# Patient Record
Sex: Female | Born: 1975 | Race: White | Hispanic: No | Marital: Single | Smoking: Never smoker
Health system: Southern US, Community
[De-identification: ages and names within clinical notes are randomized; demographics above are authoritative.]

## PROBLEM LIST (undated history)

## (undated) DIAGNOSIS — N879 Dysplasia of cervix uteri, unspecified: Secondary | ICD-10-CM

## (undated) DIAGNOSIS — F419 Anxiety disorder, unspecified: Secondary | ICD-10-CM

## (undated) DIAGNOSIS — K219 Gastro-esophageal reflux disease without esophagitis: Secondary | ICD-10-CM

## (undated) DIAGNOSIS — R87629 Unspecified abnormal cytological findings in specimens from vagina: Secondary | ICD-10-CM

## (undated) DIAGNOSIS — Z87410 Personal history of cervical dysplasia: Secondary | ICD-10-CM

## (undated) DIAGNOSIS — IMO0001 Reserved for inherently not codable concepts without codable children: Secondary | ICD-10-CM

## (undated) DIAGNOSIS — M533 Sacrococcygeal disorders, not elsewhere classified: Secondary | ICD-10-CM

## (undated) DIAGNOSIS — G8929 Other chronic pain: Secondary | ICD-10-CM

## (undated) DIAGNOSIS — I10 Essential (primary) hypertension: Secondary | ICD-10-CM

## (undated) DIAGNOSIS — M545 Low back pain: Principal | ICD-10-CM

## (undated) DIAGNOSIS — N949 Unspecified condition associated with female genital organs and menstrual cycle: Secondary | ICD-10-CM

## (undated) DIAGNOSIS — C801 Malignant (primary) neoplasm, unspecified: Secondary | ICD-10-CM

## (undated) HISTORY — DX: Unspecified condition associated with female genital organs and menstrual cycle: N94.9

## (undated) HISTORY — DX: Other chronic pain: G89.29

## (undated) HISTORY — DX: Malignant (primary) neoplasm, unspecified: C80.1

## (undated) HISTORY — DX: Unspecified abnormal cytological findings in specimens from vagina: R87.629

## (undated) HISTORY — PX: BACK SURGERY: SHX140

## (undated) HISTORY — PX: CHOLECYSTECTOMY: SHX55

## (undated) HISTORY — DX: Sacrococcygeal disorders, not elsewhere classified: M53.3

## (undated) HISTORY — DX: Gastro-esophageal reflux disease without esophagitis: K21.9

## (undated) HISTORY — DX: Low back pain: M54.5

## (undated) HISTORY — DX: Personal history of cervical dysplasia: Z87.410

## (undated) HISTORY — DX: Anxiety disorder, unspecified: F41.9

## (undated) HISTORY — PX: LEEP: SHX91

## (undated) HISTORY — DX: Essential (primary) hypertension: I10

## (undated) HISTORY — DX: Reserved for inherently not codable concepts without codable children: IMO0001

---

## 2002-11-24 ENCOUNTER — Other Ambulatory Visit: Admission: RE | Admit: 2002-11-24 | Discharge: 2002-11-24 | Payer: Self-pay | Admitting: Obstetrics & Gynecology

## 2002-12-15 ENCOUNTER — Ambulatory Visit (HOSPITAL_COMMUNITY): Admission: RE | Admit: 2002-12-15 | Discharge: 2002-12-15 | Payer: Self-pay | Admitting: Obstetrics & Gynecology

## 2002-12-17 ENCOUNTER — Ambulatory Visit (HOSPITAL_COMMUNITY): Admission: RE | Admit: 2002-12-17 | Discharge: 2002-12-17 | Payer: Self-pay | Admitting: Obstetrics & Gynecology

## 2004-01-28 ENCOUNTER — Inpatient Hospital Stay (HOSPITAL_COMMUNITY): Admission: AD | Admit: 2004-01-28 | Discharge: 2004-01-30 | Payer: Self-pay | Admitting: Obstetrics & Gynecology

## 2006-07-10 ENCOUNTER — Ambulatory Visit (HOSPITAL_COMMUNITY): Admission: RE | Admit: 2006-07-10 | Discharge: 2006-07-10 | Payer: Self-pay | Admitting: Obstetrics and Gynecology

## 2007-02-27 ENCOUNTER — Other Ambulatory Visit: Admission: RE | Admit: 2007-02-27 | Discharge: 2007-02-27 | Payer: Self-pay | Admitting: Obstetrics and Gynecology

## 2007-03-21 ENCOUNTER — Other Ambulatory Visit: Admission: RE | Admit: 2007-03-21 | Discharge: 2007-03-21 | Payer: Self-pay | Admitting: Obstetrics & Gynecology

## 2007-04-04 ENCOUNTER — Ambulatory Visit (HOSPITAL_COMMUNITY): Admission: RE | Admit: 2007-04-04 | Discharge: 2007-04-04 | Payer: Self-pay | Admitting: Obstetrics & Gynecology

## 2007-10-10 ENCOUNTER — Other Ambulatory Visit: Admission: RE | Admit: 2007-10-10 | Discharge: 2007-10-10 | Payer: Self-pay | Admitting: Obstetrics & Gynecology

## 2007-10-10 ENCOUNTER — Other Ambulatory Visit: Admission: RE | Admit: 2007-10-10 | Discharge: 2007-10-10 | Payer: Self-pay | Admitting: Obstetrics and Gynecology

## 2008-06-21 ENCOUNTER — Ambulatory Visit (HOSPITAL_COMMUNITY): Admission: RE | Admit: 2008-06-21 | Discharge: 2008-06-21 | Payer: Self-pay | Admitting: Pulmonary Disease

## 2008-06-30 ENCOUNTER — Ambulatory Visit (HOSPITAL_COMMUNITY): Admission: RE | Admit: 2008-06-30 | Discharge: 2008-06-30 | Payer: Self-pay | Admitting: Pulmonary Disease

## 2008-10-15 ENCOUNTER — Other Ambulatory Visit: Admission: RE | Admit: 2008-10-15 | Discharge: 2008-10-15 | Payer: Self-pay | Admitting: Obstetrics and Gynecology

## 2009-10-21 ENCOUNTER — Other Ambulatory Visit
Admission: RE | Admit: 2009-10-21 | Discharge: 2009-10-21 | Payer: Self-pay | Source: Home / Self Care | Admitting: Obstetrics & Gynecology

## 2010-06-09 ENCOUNTER — Ambulatory Visit (HOSPITAL_COMMUNITY): Admission: RE | Admit: 2010-06-09 | Payer: Self-pay | Source: Home / Self Care | Admitting: Pulmonary Disease

## 2010-06-21 ENCOUNTER — Encounter: Payer: Self-pay | Admitting: Pulmonary Disease

## 2010-10-03 NOTE — Op Note (Signed)
NAME:  Heather Mata, Heather Mata                 ACCOUNT NO.:  [id number]   MEDICAL RECORD NO.:  [id number]          PATIENT TYPE:  AMB   LOCATION:  DAY                           FACILITY:  [facility]   PHYSICIAN:  Lazaro Arms, M.D.   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   PROCEDURE:  Laser conization ablation of the cervix.   SURGEON:  Lazaro Arms, M.D.   ANESTHESIA:  Laryngeal mask airway.   FINDINGS:  Patient had an ASCUS Pap smear, but could not rule out HSIL  with negative HPV in the office, then underwent repeated her Pap smear,  it then showed high-grade dysplasia, then a colposcopy with directed  biopsies and it returned as high-grade dysplasia.  She had that back in  2004.  We did the laser at that time, as well.  Today, the findings were  identical to what I saw in the office last month.  She is on her  menstrual cycle.   DESCRIPTION OF OPERATION:  The patient was taken to the operating room,  placed in the supine position, underwent laryngeal mask airway, placed  in dorsal lithotomy position.  Graves speculum was placed and 3% acetic  acid was used and placed on the cervix.  Diagnostic colposcopy was  performed.  The above-noted findings were seen.   The laser was then used and we got a good margin of about 3 mm around  the entire transition zone, took it to a depth of 9 mm peripherally and  centrally.  There was good hemostasis.  I also put Monsel's on for  additional hemostasis.  The patient tolerated the procedure well.  She  experienced no blood loss and was taken to recovery in good, stable  condition.   Counts were correct.      Lazaro Arms, M.D.  Electronically  Signed     LHE/MEDQ  D:  04/04/2007  T:  04/04/2007  Job:  [id number]

## 2010-10-06 NOTE — H&P (Signed)
   NAME:  Heather Mata, Heather Mata                           ACCOUNT NO.:  [id number]   MEDICAL RECORD NO.:  [id number]                   PATIENT TYPE:  AMB   LOCATION:  DAY                                  FACILITY:  [facility]   PHYSICIAN:  Lazaro Arms, M.D.                DATE OF BIRTH:  1976-04-05   DATE OF ADMISSION:  12/15/2002  DATE OF DISCHARGE:                                HISTORY & PHYSICAL   HISTORY OF PRESENT ILLNESS:  Heather Mata is a 35 year old white female, gravida 1,  para 0, abortus 1 with last menstrual period of October 25, 2002 who had a Pap  smear which returned abnormal. She had a high-grade dysplasia on Pap and  high risk HPV-DNA as well a that time.  She underwent a colposcopy with  biopsy which revealed low-to-moderate grade dysplasia with __________ change  and involvement underlying endocervical glands.  The patient wants  definitive treatment and we talked about cryo.  I am really recommending a  laser of the cervix because of endocervical glandular involvement and her  high-risk HPV and DNA subtype.  As a result, she is admitted for laser  ablation of the cervix.   PAST MEDICAL HISTORY:  Distant history of asthma.   PAST SURGICAL HISTORY:  Negative.   PAST OBSTETRICAL HISTORY:  The one pregnancy termination.   MEDICATIONS:  Ventolin inhaler as needed and the Ortho Evra patch.   REVIEW OF SYSTEMS:  Otherwise negative.   PHYSICAL EXAMINATION:  HEENT:  Unremarkable.  NECK:  Thyroid is normal.  LUNGS:  Clear.  HEART:  Regular rhythm without regurgitation or gallop.  BREASTS: Deferred.  ABDOMEN:  Benign.  PELVIC:  Normal uterus tubes and ovaries.  On exam no masses, nontender.  EXTREMITIES:  Warm no edema.  NEUROLOGIC:  Exam is grossly intact.   IMPRESSION:  Moderate grade cervical dysplasia.   PLAN:  Patient is admitted for laser ablation of the cervix. She understands  the indications for the procedure, the risks of anesthesia, and the surgery,  and we will  proceed.                                              Lazaro Arms, M.D.   [name]  D:  12/16/2002  T:  12/16/2002  Job:  [id number]

## 2010-10-06 NOTE — Op Note (Signed)
NAME:  Heather Mata, Heather Mata                           ACCOUNT NO.:  [id number]   MEDICAL RECORD NO.:  [id number]                   PATIENT TYPE:  INP   LOCATION:  LDR1                                 FACILITY:  [facility]   PHYSICIAN:  Lazaro Arms, M.D.                DATE OF BIRTH:  06-Jun-1975   DATE OF PROCEDURE:  01/28/2004  DATE OF DISCHARGE:                                 OPERATIVE REPORT   DELIVERY NOTE:  [name] is a 35 year old, gravida 2, para 0, abortion 1,  progressed normally through the active phase of labor.  The infant was found  to be presenting on the perineum.  The patient had an epidural and did not  have any sensation of pushing at all.  The patient then began pushing,  pushed about 10 minutes, delivered a viable female at 2001 with Apgars of 9  and 9, weighing 7 pounds 1 ounce.  There was a two-vessel cord which was  found by ultrasound.  Placenta was normal.  Midline episiotomy was made.  There was no extension.  Cord blood and cord gas was sent. Placenta was  delivered spontaneously.  The uterus was firm and three fingerbreadths below  the umbilicus.  Blood loss for the delivery was __________  mL.  Midline  episiotomy was repair without difficulty.  The infant will go the routine  neonatal care and the patient will undergo routine postpartum care.  The  epidural catheter was removed, the blue tip intact.      ___________________________________________                                            Lazaro Arms, M.D.   LHE/MEDQ  D:  01/28/2004  T:  01/29/2004  Job:  [id number]

## 2010-10-06 NOTE — Op Note (Signed)
NAME:  Heather Mata, Heather Mata                           ACCOUNT NO.:  [id number]   MEDICAL RECORD NO.:  [id number]                   PATIENT TYPE:  INP   LOCATION:  LDR1                                 FACILITY:  [facility]   PHYSICIAN:  Lazaro Arms, M.D.                DATE OF BIRTH:  09-23-1975   DATE OF PROCEDURE:  01/28/2004  DATE OF DISCHARGE:                                 OPERATIVE REPORT   PROCEDURE:  Epidural placement note.   INDICATIONS:  The patient is a 35 year old, gravid 2, para 0, at term, AB 1,  in active phase of labor requesting an epidural.   DESCRIPTION OF PROCEDURE:  She was placed in the sitting position.  The L3  and L4 interspace identified.  The area was Betadine prepped and __________  drape.  One percent lidocaine was injected as a local anesthetic.  A 17-  gauge Tuohy needle was used and loss-of-resistance technique employed.  The  epidural space was found on the third pass.  The first two passes I was left  of the midline.  A loss-of-resistance technique employed and the epidural  space was found.  Bupivacaine plain 0.125% , 10 mL, was given.  The epidural  catheter was threaded into the space, 5 cm into the space.  Additional 10 mL  was given with no ill effects.  It was then taped down again 5 cm into the  space.  Continuous pump of 0.125% bupivacaine with 2 mcg/mL of fentanyl was  then begun at 12 mL an hour.  The patient had good pain relief.  She  experienced hypotension which resolved with increasing her fluids, IV and IM  ephedrine.  She became nauseated but that resolved aspirin well.  There was  never any fetal heart rate changes.  The patient has good comfort and will  go to routine labor management.      ___________________________________________                                            Lazaro Arms, M.D.   LHE/MEDQ  D:  01/28/2004  T:  01/29/2004  Job:  [id number]

## 2010-10-06 NOTE — H&P (Signed)
NAME:  Heather Mata, Heather Mata                           ACCOUNT NO.:  [id number]   MEDICAL RECORD NO.:  [id number]                   PATIENT TYPE:  INP   LOCATION:  LDR1                                 FACILITY:  [facility]   PHYSICIAN:  Lazaro Arms, M.D.                DATE OF BIRTH:  [birth year]   DATE OF ADMISSION:  01/28/2004  DATE OF DISCHARGE:                                HISTORY & PHYSICAL   HISTORY OF PRESENT ILLNESS:  Heather Mata is a 35 year old white female, gravida 2,  para 0, abortus 1, estimated date of delivery of February 16, 2004,  currently at 37-2/[redacted] weeks gestation, who is admitted in labor and delivery  in labor.  The patient was 3 cm three days ago.  Today in the office, was 4  cm, 50%, 0 to +1 station and bulging bag of water.  She states she was  having regular uterine contractions.  She was sent to labor and delivery and  found to be contracting every three minutes.  __________ came over and  checked her again, and she was 5.  As a result, she is admitted in labor.   PAST MEDICAL HISTORY:  Significant for cervical dysplasia.   PAST SURGICAL HISTORY:  She had a laser ablation of the cervix.  Past OB is  negative.   REVIEW OF SYSTEMS:  Negative.  This pregnancy is complicated by the  appearance of a two-vessel cord.  She went to [facility] and had a  normal high-resolution scan.  She also had a marginal previa that resolved.  Her AFP test was negative.   LABORATORY DATA:  Blood type is D positive, antibody screen is negative.  Rubella is immune.  Hepatitis B was negative.  HIV was nonreactive.  HSV-1  was positive.  HSV-2 was negative.  She has a history of cold sores.  Serology is nonreactive.  Pap was normal.  GC and chlamydia were both  negative.  AFP was normal as above.  Group B strep was negative.  Glucola  was abnormal, but she had a normal three-hour GGT.   PHYSICAL EXAMINATION:  HEENT:  Unremarkable.  Thyroid is normal.  LUNGS:  Clear.  HEART:  Regular rate and  rhythm without murmur, rubs or gallop.  BREASTS:  Without masses, discharges, or skin changes.  ABDOMEN:  Fundal height is 35 cm.  PELVIC:  Cervix as above.  Now 550 and 0 to +1 bulging bag of water, soft,  mid plane.  EXTREMITIES:  Warm with no edema.   IMPRESSION:  1.  Intrauterine pregnancy at 37-2/[redacted] weeks gestation.  2.  Labor.   PLAN:  The patient is admitted for management of labor.  She is requesting  an epidural which will be placed.  She will be augmented if needed, and  undergo an amniotomy.     ___________________________________________  Lazaro Arms, M.D.   [name]  D:  01/28/2004  T:  01/28/2004  Job:  [id number]

## 2010-10-06 NOTE — Procedures (Signed)
NAME:  Heather Mata, Heather Mata                 ACCOUNT NO.:  [id number]   MEDICAL RECORD NO.:  [id number]          PATIENT TYPE:  OUT   LOCATION:  RESP                          FACILITY:  [facility]   PHYSICIAN:  Edward L. Juanetta Gosling, M.D.DATE OF BIRTH:  25-Sep-1975   DATE OF PROCEDURE:  DATE OF DISCHARGE:  06/30/2008                            PULMONARY FUNCTION TEST   PULMONARY FUNCTION TEST:  1. Spirometry shows no airflow obstruction and normal flows and      volumes.  2. Lung volumes are normal.  3. DLCO is normal.      Edward L. Juanetta Gosling, M.D.  Electronically Signed     ELH/MEDQ  D:  07/02/2008  T:  07/02/2008  Job:  [id number]

## 2010-10-06 NOTE — Op Note (Signed)
   NAME:  Heather Mata, Heather Mata                           ACCOUNT NO.:  [id number]   MEDICAL RECORD NO.:  [id number]                   PATIENT TYPE:  AMB   LOCATION:  DAY                                  FACILITY:  [facility]   PHYSICIAN:  Lazaro Arms, M.D.                DATE OF BIRTH:  1976/04/07   DATE OF PROCEDURE:  12/17/2002  DATE OF DISCHARGE:                                 OPERATIVE REPORT   PREOPERATIVE DIAGNOSIS:  Moderate squamous intraepithelial lesion dysplasia.   POSTOPERATIVE DIAGNOSIS:  Moderate squamous intraepithelial lesion  dysplasia.   PROCEDURE:  Laser ablation of cervix.   SURGEON:  Lazaro Arms, M.D.   ANESTHESIA:  Laryngeal mask airway.   FINDINGS:  The patient underwent a colposcopic directed biopsy in the office  after an abnormal Pap smear. She was found to have moderate cervical  dysplasia with koilocytic atypia and endocervical glandular involvement.  As  a result she was admitted for laser ablation of the cervix.   DESCRIPTION OF OPERATION:  The patient was taken to the operating room,  placed in the supine position, where she underwent laryngeal mask airway.  She was then placed in the dorsal lithotomy position.  The perineum and  vagina were prepped with Betadine.  Moist towels were placed.  The speculum  was placed and a paracervical block was placed using 10 cc of 0.5%  Sensorcaine with 1:200,000 epinephrine bilaterally, 20 cc total.  The  microscope was then used.  Acetic acid was placed. A repeat colposcopy was  performed and confirmed the abnormality of the cervix.  The entire  transitional zone underwent ablation by the holmium laser to a depth of 7 mm  centrally and 5-7 mm peripherally.  There was good hemostasis, really no  bleeding at all.  Monsel's was placed in the procedure for long-term  hemostasis.  Speculum was removed.  The patient tolerated the procedure  well. She experienced no blood loss and was taken to recovery in good stable  condition.  All counts were correct.  She received Ancef and Toradol  prophylactically preoperatively.                                               Lazaro Arms, M.D.    [name]  D:  12/17/2002  T:  12/17/2002  Job:  [id number]

## 2011-01-21 ENCOUNTER — Encounter: Payer: Self-pay | Admitting: *Deleted

## 2011-01-21 ENCOUNTER — Emergency Department (HOSPITAL_COMMUNITY)
Admission: EM | Admit: 2011-01-21 | Discharge: 2011-01-21 | Disposition: A | Discharge status: Home / Self Care | Payer: BC Managed Care – PPO | Attending: Emergency Medicine | Admitting: Emergency Medicine

## 2011-01-21 ENCOUNTER — Emergency Department (HOSPITAL_COMMUNITY): Payer: BC Managed Care – PPO

## 2011-01-21 DIAGNOSIS — R0602 Shortness of breath: Secondary | ICD-10-CM | POA: Insufficient documentation

## 2011-01-21 DIAGNOSIS — J45909 Unspecified asthma, uncomplicated: Secondary | ICD-10-CM | POA: Insufficient documentation

## 2011-01-21 DIAGNOSIS — J029 Acute pharyngitis, unspecified: Secondary | ICD-10-CM

## 2011-01-21 DIAGNOSIS — IMO0001 Reserved for inherently not codable concepts without codable children: Secondary | ICD-10-CM

## 2011-01-21 LAB — URINALYSIS, ROUTINE W REFLEX MICROSCOPIC
Bilirubin Urine: NEGATIVE
Hgb urine dipstick: NEGATIVE
Leukocytes, UA: NEGATIVE
Nitrite: NEGATIVE
Urobilinogen, UA: 0.2 mg/dL (ref 0.0–1.0)

## 2011-01-21 LAB — POCT I-STAT, CHEM 8
Calcium, Ion: 1.26 mmol/L (ref 1.12–1.32)
Glucose, Bld: 100 mg/dL — ABNORMAL HIGH (ref 70–99)
TCO2: 24 mmol/L (ref 0–100)

## 2011-01-21 LAB — RAPID STREP SCREEN (MED CTR MEBANE ONLY): Streptococcus, Group A Screen (Direct): NEGATIVE

## 2011-01-21 MED ORDER — LORAZEPAM 1 MG PO TABS
0.5000 mg | ORAL_TABLET | Freq: Once | ORAL | Status: AC
  Administered 2011-01-21: 0.5 mg via ORAL
  Filled 2011-01-21: qty 1

## 2011-01-21 MED ORDER — ACETAMINOPHEN 325 MG PO TABS
ORAL_TABLET | ORAL | Status: AC
  Administered 2011-01-21: 21:00:00
  Filled 2011-01-21: qty 2

## 2011-01-21 MED ORDER — ALBUTEROL SULFATE (5 MG/ML) 0.5% IN NEBU
5.0000 mg | INHALATION_SOLUTION | Freq: Once | RESPIRATORY_TRACT | Status: AC
  Administered 2011-01-21: 5 mg via RESPIRATORY_TRACT
  Filled 2011-01-21 (×2): qty 0.5

## 2011-01-21 MED ORDER — IPRATROPIUM BROMIDE 0.02 % IN SOLN
0.5000 mg | Freq: Once | RESPIRATORY_TRACT | Status: AC
  Administered 2011-01-21: 0.5 mg via RESPIRATORY_TRACT
  Filled 2011-01-21: qty 2.5

## 2011-01-21 MED ORDER — LORAZEPAM 1 MG PO TABS: ORAL_TABLET | ORAL | Status: DC

## 2011-01-21 NOTE — ED Notes (Signed)
Pt c/o shortness of breath x 2 1/2 weeks. Pt also has laryngitis. Pt has cough but is not productive. Denies fever.

## 2011-01-21 NOTE — ED Notes (Signed)
Dr. Richrd Prime at patient bedside.

## 2011-01-21 NOTE — ED Provider Notes (Signed)
History     CSN: [id number] Arrival date & time: 01/21/2011  5:27 PM  Chief Complaint  Patient presents with  . Shortness of Breath   HPI Pt was seen at 1750.  Per pt, c/o gradual onset and persistence of constant SOB, cough and "laryngitis" x2-3 weeks.  Pt has had multiple visits to her PMD for same.  Has another appt with PMD already scheduled for Tuesday.  Pt states she has been taking PO and inhaled steroids, nebs QID, abx x2 courses, and antihistamines.  States she "still feel like I can't breathe."  Denies CP/palpitations, no fevers, no rash, no wheezing/stridor, no abd pain, no back pain, no N/V/D, no sore throat, no intra-oral edema.    Past Medical History  Diagnosis Date  . Asthma     Past Surgical History  Procedure Date  . Cholecystectomy     History reviewed. No pertinent family history.  History  Substance Use Topics  . Smoking status: Never Smoker   . Smokeless tobacco: Not on file  . Alcohol Use: No   Review of Systems ROS: Statement: All systems negative except as marked or noted in the HPI; Constitutional: Negative for fever and chills. ; ; Eyes: Negative for eye pain, redness and discharge. ; ; ENMT: Negative for ear pain, nasal congestion, sinus pressure, +sore throat and hoarseness. ; ; Cardiovascular: Negative for chest pain, palpitations, diaphoresis, and peripheral edema. ; ; Respiratory: +cough and SOB.  Negative for wheezing and stridor. ; ; Gastrointestinal: Negative for nausea, vomiting, diarrhea and abdominal pain, blood in stool, hematemesis, jaundice and rectal bleeding. . ; ; Genitourinary: Negative for dysuria, flank pain and hematuria. ; ; Musculoskeletal: Negative for back pain and neck pain. Negative for swelling and trauma.; ; Skin: Negative for pruritus, rash, abrasions, blisters, bruising and skin lesion.; ; Neuro: Negative for headache, lightheadedness and neck stiffness. Negative for weakness, altered level of consciousness , altered mental  status, extremity weakness, paresthesias, involuntary movement, seizure and syncope.    Physical Exam  BP 135/92  Pulse 120  Temp(Src) 98.6 F (37 C) (Oral)  Resp 20  Ht 5\' 2"  (1.575 m)  Wt 135 lb (61.236 kg)  BMI 24.69 kg/m2  SpO2 98%  LMP 12/22/2010 BP 126/84  Pulse 77  Temp(Src) 98.6 F (37 C) (Oral)  Resp 16  Ht 5\' 2"  (1.575 m)  Wt 135 lb (61.236 kg)  BMI 24.69 kg/m2  SpO2 97%  LMP 12/22/2010   Physical Exam 1755: Physical examination:  Nursing notes reviewed; Vital signs and O2 SAT reviewed;  Constitutional: Well developed, Well nourished, Well hydrated, In no acute distress; Head:  Normocephalic, atraumatic; Eyes: EOMI, PERRL, No scleral icterus; ENMT:  Mouth and pharynx normal, Mucous membranes moist, +voice hoarse, no drooling, no stridor.  No intra-oral edema or erythema.; Neck: Supple, Full range of motion, No lymphadenopathy; Cardiovascular: Regular rate and rhythm, No murmur, rub, or gallop; Respiratory: Breath sounds clear & equal bilaterally, No rales, rhonchi, wheezes, or rub, Normal respiratory effort/excursion; Chest: Nontender, Movement normal; Abdomen: Soft, Nontender, Nondistended, Normal bowel sounds;  Extremities: Pulses normal, No tenderness, No edema, No calf edema or asymmetry.; Neuro: AA&Ox3, Major CN grossly intact.  No gross focal motor or sensory deficits in extremities.; Skin: Color normal, Warm, Dry, no rash.  Affect:  +anxious.    ED Course  Procedures  MDM MDM Reviewed: nursing note and vitals Interpretation: labs and x-ray   1805:  Pt's father pulled me outside the pt's room after my eval.  States pt has been "very very anxious" and "when she gets more anxious her symptoms get worse."  Pt's father states pt was "doing better until her friend last night told her that she might not be getting enough oxygen into her" and "that made her get worse again."  He states to me that "she will never tell you that she's anxious."  Reassured.  Will ck CXR,  R/S, labs.  Will dose neb and ativan.  Pt and father agreeable with plan.     Results for orders placed during the hospital encounter of 01/21/11  URINALYSIS, ROUTINE W REFLEX MICROSCOPIC      Component Value Range   Color, Urine YELLOW  YELLOW    Appearance CLEAR  CLEAR    Specific Gravity, Urine <1.005 (*) 1.005 - 1.030    pH 7.0  5.0 - 8.0    Glucose, UA NEGATIVE  NEGATIVE (mg/dL)   Hgb urine dipstick NEGATIVE  NEGATIVE    Bilirubin Urine NEGATIVE  NEGATIVE    Ketones, ur NEGATIVE  NEGATIVE (mg/dL)   Protein, ur NEGATIVE  NEGATIVE (mg/dL)   Urobilinogen, UA 0.2  0.0 - 1.0 (mg/dL)   Nitrite NEGATIVE  NEGATIVE    Leukocytes, UA NEGATIVE  NEGATIVE   D-DIMER, QUANTITATIVE      Component Value Range   D-Dimer, Quant 0.25  0.00 - 0.48 (ug/mL-FEU)  RAPID STREP SCREEN      Component Value Range   Streptococcus, Group A Screen (Direct) NEGATIVE  NEGATIVE   POCT PREGNANCY, URINE      Component Value Range   Preg Test, Ur NEGATIVE    POCT I-STAT, CHEM 8      Component Value Range   Sodium 137  135 - 145 (mEq/L)   Potassium 4.1  3.5 - 5.1 (mEq/L)   Chloride 105  96 - 112 (mEq/L)   BUN 21  6 - 23 (mg/dL)   Creatinine, Ser 0.98  0.50 - 1.10 (mg/dL)   Glucose, Bld 119 (*) 70 - 99 (mg/dL)   Calcium, Ion 1.47  8.29 - 1.32 (mmol/L)   TCO2 24  0 - 100 (mmol/L)   Hemoglobin 13.6  12.0 - 15.0 (g/dL)   HCT 56.2  13.0 - 86.5 (%)   Dg Neck Soft Tissue  01/21/2011  *RADIOLOGY REPORT*  Clinical Data: Cough and shortness of breath.  Sore throat.  NECK SOFT TISSUES - 1+ VIEW  Comparison: None.  Findings: The airway is patent.  No focal soft tissue abnormality is evident.  The prevertebral soft tissues are within normal limits.  The epiglottis is unremarkable.  The lung apices are clear.  IMPRESSION: Negative soft tissue neck.  Original Report Authenticated By: Jamesetta Orleans. MATTERN, M.D.   Dg Chest 2 View  01/21/2011  *RADIOLOGY REPORT*  Clinical Data: Cough.  Sore throat.  CHEST - 2 VIEW   Comparison: 06/21/2008  Findings: Lateral view degraded by patient arm position.  Midline trachea.  Normal heart size and mediastinal contours. No pleural effusion or pneumothorax.  Clear lungs.  IMPRESSION: Normal chest.  Original Report Authenticated By: Consuello Bossier, M.D.   8:40 PM:  Improved after ativan and neb, appear less anxious.  States she feels "better" now and wants to go home.  States she is "hungry," and requesting tylenol for generalized "aches."  Already is on steroids, has been on 2 courses of abx, already has nebs at home.  Doubt infectious process at this time.  Possible anxiety component.  Will dose tylenol and  d/c home with outpt f/u on Tuesday as previously scheduled.  Dx testing d/w pt and family.  Questions answered.  Verb understanding, agreeable to d/c home with outpt f/u.        Medications given in ED:  albuterol (PROVENTIL) (5 MG/ML) 0.5% nebulizer solution 5 mg (5 mg Nebulization Given 01/21/11 1845)  ipratropium (ATROVENT) 0.02 % nebulizer solution 0.5 mg (0.5 mg Nebulization Given 01/21/11 1845)  LORazepam (ATIVAN) tablet 0.5 mg (0.5 mg Oral Given 01/21/11 1858)   New Prescriptions   LORAZEPAM (ATIVAN) 1 MG TABLET    ONE HALF to one tablet PO BID prn anxiety or sleeplessness   Paula Zietz Allison Quarry, DO 01/24/11 1356

## 2011-02-08 ENCOUNTER — Other Ambulatory Visit: Payer: Self-pay | Admitting: Obstetrics & Gynecology

## 2011-02-08 ENCOUNTER — Other Ambulatory Visit (HOSPITAL_COMMUNITY)
Admission: RE | Admit: 2011-02-08 | Discharge: 2011-02-08 | Disposition: A | Discharge status: Home / Self Care | Payer: BC Managed Care – PPO | Source: Ambulatory Visit | Attending: Obstetrics & Gynecology | Admitting: Obstetrics & Gynecology

## 2011-02-08 DIAGNOSIS — Z01419 Encounter for gynecological examination (general) (routine) without abnormal findings: Secondary | ICD-10-CM | POA: Insufficient documentation

## 2011-02-08 DIAGNOSIS — Z1159 Encounter for screening for other viral diseases: Secondary | ICD-10-CM | POA: Insufficient documentation

## 2011-02-27 LAB — CBC
Hemoglobin: 12.8
MCHC: 34.3
MCV: 87.9
RBC: 4.24

## 2011-02-27 LAB — DIFFERENTIAL
Basophils Absolute: 0
Basophils Relative: 0
Lymphocytes Relative: 28
Lymphs Abs: 2
Neutro Abs: 4.3
Neutrophils Relative %: 61

## 2011-02-27 LAB — COMPREHENSIVE METABOLIC PANEL
AST: 22
Alkaline Phosphatase: 62
Chloride: 105
Creatinine, Ser: 0.6
Glucose, Bld: 87
Potassium: 4.7
Total Bilirubin: 0.7

## 2011-02-27 LAB — URINALYSIS, ROUTINE W REFLEX MICROSCOPIC
Hgb urine dipstick: NEGATIVE
Ketones, ur: NEGATIVE
Nitrite: NEGATIVE
Urobilinogen, UA: 0.2
pH: 6

## 2011-05-03 ENCOUNTER — Other Ambulatory Visit (HOSPITAL_COMMUNITY): Payer: Self-pay | Admitting: Pulmonary Disease

## 2011-05-03 ENCOUNTER — Ambulatory Visit (HOSPITAL_COMMUNITY)
Admission: RE | Admit: 2011-05-03 | Discharge: 2011-05-03 | Disposition: A | Discharge status: Home / Self Care | Payer: BC Managed Care – PPO | Source: Ambulatory Visit | Attending: Pulmonary Disease | Admitting: Pulmonary Disease

## 2011-05-03 DIAGNOSIS — M545 Low back pain, unspecified: Secondary | ICD-10-CM | POA: Insufficient documentation

## 2012-01-30 ENCOUNTER — Other Ambulatory Visit: Payer: Self-pay | Admitting: Adult Health

## 2012-01-30 DIAGNOSIS — Z1151 Encounter for screening for human papillomavirus (HPV): Secondary | ICD-10-CM | POA: Insufficient documentation

## 2012-01-30 DIAGNOSIS — Z01419 Encounter for gynecological examination (general) (routine) without abnormal findings: Secondary | ICD-10-CM | POA: Insufficient documentation

## 2012-01-30 DIAGNOSIS — Z113 Encounter for screening for infections with a predominantly sexual mode of transmission: Secondary | ICD-10-CM | POA: Insufficient documentation

## 2012-02-05 ENCOUNTER — Other Ambulatory Visit (HOSPITAL_COMMUNITY)
Admission: RE | Admit: 2012-02-05 | Discharge: 2012-02-05 | Disposition: A | Discharge status: Home / Self Care | Payer: Medicaid Other | Source: Ambulatory Visit | Attending: Obstetrics and Gynecology | Admitting: Obstetrics and Gynecology

## 2012-10-20 ENCOUNTER — Other Ambulatory Visit: Payer: Self-pay | Admitting: Obstetrics & Gynecology

## 2012-12-11 ENCOUNTER — Encounter (HOSPITAL_COMMUNITY): Payer: Self-pay | Admitting: *Deleted

## 2012-12-11 ENCOUNTER — Emergency Department (HOSPITAL_COMMUNITY)
Admission: EM | Admit: 2012-12-11 | Discharge: 2012-12-11 | Disposition: A | Discharge status: Home / Self Care | Payer: BC Managed Care – PPO | Attending: Emergency Medicine | Admitting: Emergency Medicine

## 2012-12-11 DIAGNOSIS — S161XXA Strain of muscle, fascia and tendon at neck level, initial encounter: Secondary | ICD-10-CM

## 2012-12-11 DIAGNOSIS — Y9389 Activity, other specified: Secondary | ICD-10-CM | POA: Insufficient documentation

## 2012-12-11 DIAGNOSIS — J45909 Unspecified asthma, uncomplicated: Secondary | ICD-10-CM | POA: Insufficient documentation

## 2012-12-11 DIAGNOSIS — Z79899 Other long term (current) drug therapy: Secondary | ICD-10-CM | POA: Insufficient documentation

## 2012-12-11 DIAGNOSIS — S139XXA Sprain of joints and ligaments of unspecified parts of neck, initial encounter: Secondary | ICD-10-CM | POA: Insufficient documentation

## 2012-12-11 DIAGNOSIS — Y92410 Unspecified street and highway as the place of occurrence of the external cause: Secondary | ICD-10-CM | POA: Insufficient documentation

## 2012-12-11 DIAGNOSIS — Z87410 Personal history of cervical dysplasia: Secondary | ICD-10-CM | POA: Insufficient documentation

## 2012-12-11 MED ORDER — OXYCODONE-ACETAMINOPHEN 5-325 MG PO TABS: 1.0000 | ORAL_TABLET | ORAL | Status: DC | PRN
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
## 2013-01-01 ENCOUNTER — Ambulatory Visit (HOSPITAL_COMMUNITY)
Admission: RE | Admit: 2013-01-01 | Discharge: 2013-01-01 | Disposition: A | Discharge status: Home / Self Care | Payer: BC Managed Care – PPO | Source: Ambulatory Visit | Attending: Pulmonary Disease | Admitting: Pulmonary Disease

## 2013-01-01 ENCOUNTER — Other Ambulatory Visit (HOSPITAL_COMMUNITY): Payer: Self-pay | Admitting: Pulmonary Disease

## 2013-01-01 DIAGNOSIS — R52 Pain, unspecified: Secondary | ICD-10-CM

## 2013-01-01 DIAGNOSIS — N949 Unspecified condition associated with female genital organs and menstrual cycle: Secondary | ICD-10-CM | POA: Insufficient documentation

## 2013-01-15 ENCOUNTER — Other Ambulatory Visit: Payer: Self-pay | Admitting: Obstetrics & Gynecology

## 2013-02-05 ENCOUNTER — Other Ambulatory Visit (HOSPITAL_COMMUNITY)
Admission: RE | Admit: 2013-02-05 | Discharge: 2013-02-05 | Disposition: A | Discharge status: Home / Self Care | Payer: BC Managed Care – PPO | Source: Ambulatory Visit | Attending: Obstetrics & Gynecology | Admitting: Obstetrics & Gynecology

## 2013-02-05 ENCOUNTER — Encounter: Payer: Self-pay | Admitting: Obstetrics & Gynecology

## 2013-02-05 ENCOUNTER — Ambulatory Visit (INDEPENDENT_AMBULATORY_CARE_PROVIDER_SITE_OTHER): Payer: BC Managed Care – PPO | Admitting: Obstetrics & Gynecology

## 2013-02-05 VITALS — BP 120/66 | Ht 61.0 in | Wt 150.0 lb

## 2013-02-05 DIAGNOSIS — J45909 Unspecified asthma, uncomplicated: Secondary | ICD-10-CM | POA: Insufficient documentation

## 2013-02-05 DIAGNOSIS — Z01419 Encounter for gynecological examination (general) (routine) without abnormal findings: Secondary | ICD-10-CM | POA: Insufficient documentation

## 2013-02-05 MED ORDER — DESOGESTREL-ETHINYL ESTRADIOL 0.15-0.02/0.01 MG (21/5) PO TABS: 1.0000 | ORAL_TABLET | Freq: Every day | ORAL | Status: DC

## 2013-02-05 NOTE — Progress Notes (Signed)
Patient ID: Heather Mata, female   DOB: 11-05-1975, 37 y.o.   MRN: [id number] Subjective:     Heather Mata is a 37 y.o. female here for a routine exam.  Patient's last menstrual period was 01/28/2013. No obstetric history on file. Current complaints: none.  Personal health questionnaire reviewed: no.  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 Contraception: OCP (estrogen/progesterone) Last Pap: 2013. Results were: normal Last mammogram: na. Results were: abnormal  Obstetric History OB History  No data available     The following portions of the patient's history were reviewed and updated as appropriate: allergies, current medications, past family history, past medical history, past social history, past surgical history and problem list.  Review of Systems  Review of Systems  Constitutional: Negative for fever, chills, weight loss, malaise/fatigue and diaphoresis.  HENT: Negative for hearing loss, ear pain, nosebleeds, congestion, sore throat, neck pain, tinnitus and ear discharge.   Eyes: Negative for blurred vision, double vision, photophobia, pain, discharge and redness.  Respiratory: Negative for cough, hemoptysis, sputum production, shortness of breath, wheezing and stridor.   Cardiovascular: Negative for chest pain, palpitations, orthopnea, claudication, leg swelling and PND.  Gastrointestinal: negative for abdominal pain. Negative for heartburn, nausea, vomiting, diarrhea, constipation, blood in stool and melena.  Genitourinary: Negative for dysuria, urgency, frequency, hematuria and flank pain.  Musculoskeletal: Negative for myalgias, back pain, joint pain and falls.  Skin: Negative for itching and rash.  Neurological: Negative for dizziness, tingling, tremors, sensory change, speech change, focal weakness, seizures, loss of consciousness, weakness and headaches.   Endo/Heme/Allergies: Negative for environmental allergies and polydipsia. Does not bruise/bleed easily.  Psychiatric/Behavioral: Negative for depression, suicidal ideas, hallucinations, memory loss and substance abuse. The patient is not nervous/anxious and does not have insomnia.        Objective:    Physical Exam  Vitals reviewed. Constitutional: She is oriented to person, place, and time. She appears well-developed and well-nourished.  HENT:  Head: Normocephalic and atraumatic.        Right Ear: External ear normal.  Left Ear: External ear normal.  Nose: Nose normal.  Mouth/Throat: Oropharynx is clear and moist.  Eyes: Conjunctivae and EOM are normal. Pupils are equal, round, and reactive to light. Right eye exhibits no discharge. Left eye exhibits no discharge. No scleral icterus.  Neck: Normal range of motion. Neck supple. No tracheal deviation present. No thyromegaly present.  Cardiovascular: Normal rate, regular rhythm, normal heart sounds and intact distal pulses.  Exam reveals no gallop and no friction rub.   No murmur heard. Respiratory: Effort normal and breath sounds normal. No respiratory distress. She has no wheezes. She has no rales. She exhibits no tenderness.  GI: Soft. Bowel sounds are normal. She exhibits no distension and no mass. There is no tenderness. There is no rebound and no guarding.  Genitourinary:       Vulva is normal without lesions Vagina is pink moist without discharge Cervix normal in appearance and pap is done Uterus is normal size shape and contour Adnexa is negative with normal sized ovaries   Musculoskeletal: Normal range of motion. She exhibits no edema and no tenderness.  Neurological: She is alert and oriented to person, place, and time. She has normal reflexes. She displays normal reflexes. No cranial nerve deficit. She exhibits normal muscle tone. Coordination normal.  Skin: Skin is warm and  dry. No rash noted. No erythema. No pallor.   Psychiatric: She has a normal mood and affect. Her behavior is normal. Judgment and thought content normal.       Assessment:    Healthy female exam.    Plan:    Contraception: OCP (estrogen/progesterone). Follow up in: 1 year.

## 2013-03-26 ENCOUNTER — Other Ambulatory Visit: Payer: Self-pay

## 2013-07-22 ENCOUNTER — Other Ambulatory Visit: Payer: Self-pay | Admitting: Obstetrics & Gynecology

## 2014-01-07 ENCOUNTER — Encounter (INDEPENDENT_AMBULATORY_CARE_PROVIDER_SITE_OTHER): Payer: Self-pay | Admitting: *Deleted

## 2014-01-20 ENCOUNTER — Other Ambulatory Visit: Payer: Self-pay | Admitting: Obstetrics & Gynecology

## 2014-01-28 ENCOUNTER — Other Ambulatory Visit: Payer: Self-pay | Admitting: Obstetrics & Gynecology

## 2014-01-28 MED ORDER — NYSTATIN 100000 UNIT/ML MT SUSP: 5.0000 mL | Freq: Four times a day (QID) | OROMUCOSAL | Status: DC

## 2014-01-28 NOTE — Telephone Encounter (Signed)
Has history of previous oral yeast when has asthma or reflux flares  Rx Nystatin mouthwash

## 2014-02-02 ENCOUNTER — Other Ambulatory Visit (INDEPENDENT_AMBULATORY_CARE_PROVIDER_SITE_OTHER): Payer: Self-pay | Admitting: *Deleted

## 2014-02-02 ENCOUNTER — Encounter (INDEPENDENT_AMBULATORY_CARE_PROVIDER_SITE_OTHER): Payer: Self-pay | Admitting: Internal Medicine

## 2014-02-02 ENCOUNTER — Encounter (INDEPENDENT_AMBULATORY_CARE_PROVIDER_SITE_OTHER): Payer: Self-pay | Admitting: *Deleted

## 2014-02-02 ENCOUNTER — Ambulatory Visit (INDEPENDENT_AMBULATORY_CARE_PROVIDER_SITE_OTHER): Payer: BC Managed Care – PPO | Admitting: Internal Medicine

## 2014-02-02 VITALS — BP 90/64 | HR 76 | Temp 98.2°F | Ht 61.5 in | Wt 155.8 lb

## 2014-02-02 DIAGNOSIS — K2100 Gastro-esophageal reflux disease with esophagitis, without bleeding: Secondary | ICD-10-CM

## 2014-02-02 DIAGNOSIS — J45909 Unspecified asthma, uncomplicated: Secondary | ICD-10-CM | POA: Insufficient documentation

## 2014-02-02 DIAGNOSIS — K219 Gastro-esophageal reflux disease without esophagitis: Secondary | ICD-10-CM

## 2014-02-02 DIAGNOSIS — J4540 Moderate persistent asthma, uncomplicated: Secondary | ICD-10-CM

## 2014-02-02 MED ORDER — PANTOPRAZOLE SODIUM 40 MG PO TBEC: 40.0000 mg | DELAYED_RELEASE_TABLET | Freq: Two times a day (BID) | ORAL | Status: DC

## 2014-02-02 NOTE — Progress Notes (Signed)
Subjective:     Patient ID: Heather Mata, female   DOB: Nov 10, 1975, 38 y.o.   MRN: [id number]  HPI Referred to our office by Dr. Tennis Ship. She presents today with c/o that every time she ate, she felt like her stomach was on fire. She has seen Dr. Benjamine Mola  3-4 yrs ago and has seen ENT at [facility] 3-4 yrs ago and  for her asthma  She tells me she has asthma all her life.  She underwent a laryngoscopy with Dr. Benjamine Mola  and all were normal. 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   She says her acid reflux is getting worse. She says she had to stop running or exercising because the acid reflux was coming up into her esophagus.  She says she has a cough that will not go away.  She tells me the omperazole has helped if she takes two a day and takes Tums prn.   She has been on 3 different PPI which have  Worked  for about 6 months and then stops. (Prevacid, Nexium, Omprazole). Appetite good. No weight loss. Sometimes she has a bloating feeling in her epigastric region. She usually has a BM one every 2-3 days.  There is no solid foods dysphagia.  Just recently treated for a yeast infection (mouth) probably from multiple uses of her inhalers. Advil 2-3 times a week for headaches. Takes Goody Powders 2-3 times a week for headaches.     LMP  1 week which was normal.   12/26/2013 H and H 11.9 and 34.4, MCV 77.7, Platelet ct 249,  Total bili 0.3, ALP 48, AST 13, ALT 11, Albumin 4.3, HIV negative, RPR nonreactive  Review of Systems Past Medical History  Diagnosis Date  . Asthma   . Cervical dysphagia     Past Surgical History  Procedure Laterality Date  . Leep    . Cholecystectomy      age 42, non functioning    Allergies  Allergen Reactions  . Codeine Nausea And Vomiting    Current Outpatient Prescriptions on File Prior to Visit  Medication Sig Dispense Refill  . ALPRAZolam (XANAX) 1 MG tablet TAKE 1 TABLET BY MOUTH 3 TIMES A DAY FOR ANXIETY  90 tablet   5  . budesonide-formoterol (SYMBICORT) 80-4.5 MCG/ACT inhaler Inhale 2 puffs into the lungs 2 (two) times daily.        . clonazePAM (KLONOPIN) 1 MG tablet Take 1 mg by mouth at bedtime.      . cyclobenzaprine (FLEXERIL) 10 MG tablet Take 10 mg by mouth at bedtime as needed.       . desogestrel-ethinyl estradiol (KARIVA) 0.15-0.02/0.01 MG (21/5) tablet Take 1 tablet by mouth daily.  1 Package  11  . LASTACAFT 0.25 % SOLN Place 1 drop into both eyes as needed.       . nystatin (MYCOSTATIN) 100000 UNIT/ML suspension Take 5 mLs (500,000 Units total) by mouth 4 (four) times daily.  120 mL  2   No current facility-administered medications on file prior to visit.        Objective:   Physical Exam  Filed Vitals:   02/02/14 0917  BP: 90/64  Pulse: 76  Temp: 98.2 F (36.8 C)  Height: 5' 1.5" (1.562 m)  Weight: 155 lb 12.8 oz (70.67 kg)  Alert and oriented. Skin warm and dry. Oral mucosa is moist.   . Sclera anicteric, conjunctivae is pink. Thyroid not enlarged. No cervical lymphadenopathy. Lungs clear.  Heart regular rate and rhythm.  Abdomen is soft. Bowel sounds are positive. No hepatomegaly. No abdominal masses felt. No tenderness.  No edema to lower extremities.        Assessment:    GERD not controlled at this time. Recent hx of a yeast infection to oral cavity. Has been taking Goody Powders on a regular basis for headaches plus Advil. PUD, Candida needs to be ruled out.     Plan:    EGD.    The risks and benefits such as perforation, bleeding, and infection were reviewed with the patient and is agreeable.

## 2014-02-02 NOTE — Patient Instructions (Signed)
Stop Omeprazole. Start Protonix BID.

## 2014-02-24 ENCOUNTER — Emergency Department (HOSPITAL_COMMUNITY): Payer: BC Managed Care – PPO

## 2014-02-24 ENCOUNTER — Encounter (HOSPITAL_COMMUNITY): Payer: Self-pay | Admitting: Emergency Medicine

## 2014-02-24 ENCOUNTER — Emergency Department (HOSPITAL_COMMUNITY)
Admission: EM | Admit: 2014-02-24 | Discharge: 2014-02-24 | Disposition: A | Discharge status: Home / Self Care | Payer: BC Managed Care – PPO | Attending: Emergency Medicine | Admitting: Emergency Medicine

## 2014-02-24 DIAGNOSIS — S161XXA Strain of muscle, fascia and tendon at neck level, initial encounter: Secondary | ICD-10-CM

## 2014-02-24 DIAGNOSIS — Z880 Allergy status to penicillin: Secondary | ICD-10-CM | POA: Insufficient documentation

## 2014-02-24 DIAGNOSIS — J45909 Unspecified asthma, uncomplicated: Secondary | ICD-10-CM | POA: Diagnosis not present

## 2014-02-24 DIAGNOSIS — S134XXA Sprain of ligaments of cervical spine, initial encounter: Secondary | ICD-10-CM | POA: Insufficient documentation

## 2014-02-24 DIAGNOSIS — Y9389 Activity, other specified: Secondary | ICD-10-CM | POA: Diagnosis not present

## 2014-02-24 DIAGNOSIS — S3991XA Unspecified injury of abdomen, initial encounter: Secondary | ICD-10-CM | POA: Insufficient documentation

## 2014-02-24 DIAGNOSIS — Y92410 Unspecified street and highway as the place of occurrence of the external cause: Secondary | ICD-10-CM | POA: Insufficient documentation

## 2014-02-24 DIAGNOSIS — Z79899 Other long term (current) drug therapy: Secondary | ICD-10-CM | POA: Insufficient documentation

## 2014-02-24 DIAGNOSIS — S20212A Contusion of left front wall of thorax, initial encounter: Secondary | ICD-10-CM | POA: Diagnosis not present

## 2014-02-24 DIAGNOSIS — S199XXA Unspecified injury of neck, initial encounter: Secondary | ICD-10-CM | POA: Diagnosis present

## 2014-02-24 DIAGNOSIS — Z3202 Encounter for pregnancy test, result negative: Secondary | ICD-10-CM | POA: Diagnosis not present

## 2014-02-24 DIAGNOSIS — Z7951 Long term (current) use of inhaled steroids: Secondary | ICD-10-CM | POA: Insufficient documentation

## 2014-02-24 LAB — CBC WITH DIFFERENTIAL/PLATELET
BASOS PCT: 0 % (ref 0–1)
Basophils Absolute: 0 10*3/uL (ref 0.0–0.1)
EOS ABS: 0.1 10*3/uL (ref 0.0–0.7)
EOS PCT: 1 % (ref 0–5)
HEMATOCRIT: 35.4 % — AB (ref 36.0–46.0)
HEMOGLOBIN: 12 g/dL (ref 12.0–15.0)
Lymphocytes Relative: 19 % (ref 12–46)
Lymphs Abs: 1.2 10*3/uL (ref 0.7–4.0)
MCH: 27.7 pg (ref 26.0–34.0)
MCHC: 33.9 g/dL (ref 30.0–36.0)
MCV: 81.8 fL (ref 78.0–100.0)
MONO ABS: 0.5 10*3/uL (ref 0.1–1.0)
MONOS PCT: 7 % (ref 3–12)
Neutro Abs: 4.8 10*3/uL (ref 1.7–7.7)
Neutrophils Relative %: 73 % (ref 43–77)
Platelets: 239 10*3/uL (ref 150–400)
RBC: 4.33 MIL/uL (ref 3.87–5.11)
RDW: 13.7 % (ref 11.5–15.5)
WBC: 6.6 10*3/uL (ref 4.0–10.5)

## 2014-02-24 LAB — URINALYSIS, ROUTINE W REFLEX MICROSCOPIC
Bilirubin Urine: NEGATIVE
Glucose, UA: NEGATIVE mg/dL
KETONES UR: NEGATIVE mg/dL
LEUKOCYTES UA: NEGATIVE
NITRITE: NEGATIVE
PH: 6.5 (ref 5.0–8.0)
Protein, ur: NEGATIVE mg/dL
UROBILINOGEN UA: 0.2 mg/dL (ref 0.0–1.0)

## 2014-02-24 LAB — COMPREHENSIVE METABOLIC PANEL
ALBUMIN: 4.1 g/dL (ref 3.5–5.2)
ALT: 21 U/L (ref 0–35)
ANION GAP: 14 (ref 5–15)
AST: 23 U/L (ref 0–37)
Alkaline Phosphatase: 82 U/L (ref 39–117)
BILIRUBIN TOTAL: 0.4 mg/dL (ref 0.3–1.2)
BUN: 9 mg/dL (ref 6–23)
CO2: 25 mEq/L (ref 19–32)
CREATININE: 0.8 mg/dL (ref 0.50–1.10)
Calcium: 9.7 mg/dL (ref 8.4–10.5)
Chloride: 100 mEq/L (ref 96–112)
GFR calc non Af Amer: >90 mL/min (ref >90)
Glucose, Bld: 92 mg/dL (ref 70–99)
Potassium: 4 mEq/L (ref 3.7–5.3)
Sodium: 139 mEq/L (ref 137–147)
TOTAL PROTEIN: 7.8 g/dL (ref 6.0–8.3)

## 2014-02-24 LAB — URINE MICROSCOPIC-ADD ON

## 2014-02-24 LAB — POC URINE PREG, ED: PREG TEST UR: NEGATIVE

## 2014-02-24 MED ORDER — ONDANSETRON HCL 4 MG/2ML IJ SOLN
4.0000 mg | Freq: Once | INTRAMUSCULAR | Status: AC
  Administered 2014-02-24: 4 mg via INTRAVENOUS
  Filled 2014-02-24: qty 2

## 2014-02-24 MED ORDER — TRAMADOL HCL 50 MG PO TABS: 50.0000 mg | ORAL_TABLET | Freq: Four times a day (QID) | ORAL | Status: DC | PRN

## 2014-02-24 MED ORDER — SODIUM CHLORIDE 0.9 % IV SOLN
Freq: Once | INTRAVENOUS | Status: AC
  Administered 2014-02-24: 75 mL via INTRAVENOUS

## 2014-02-24 MED ORDER — LORAZEPAM 2 MG/ML IJ SOLN
1.0000 mg | Freq: Once | INTRAMUSCULAR | Status: DC
  Filled 2014-02-24: qty 1

## 2014-02-24 MED ORDER — LORAZEPAM 2 MG/ML IJ SOLN
1.0000 mg | Freq: Once | INTRAMUSCULAR | Status: AC
  Administered 2014-02-24: 1 mg via INTRAVENOUS
  Filled 2014-02-24: qty 1

## 2014-02-24 MED ORDER — IOHEXOL 300 MG/ML  SOLN
100.0000 mL | Freq: Once | INTRAMUSCULAR | Status: AC | PRN
  Administered 2014-02-24: 100 mL via INTRAVENOUS

## 2014-02-24 NOTE — ED Notes (Signed)
TVanessa [place]. PA at bedside.
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
## 2014-02-24 NOTE — ED Notes (Signed)
Went to CT room to given ativan.  Ativan was not needed.  Return to [employer].
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
## 2014-03-01 ENCOUNTER — Encounter (HOSPITAL_COMMUNITY): Payer: Self-pay | Admitting: Pharmacy Technician

## 2014-03-04 ENCOUNTER — Other Ambulatory Visit (HOSPITAL_COMMUNITY): Payer: Self-pay | Admitting: Pulmonary Disease

## 2014-03-04 ENCOUNTER — Encounter (INDEPENDENT_AMBULATORY_CARE_PROVIDER_SITE_OTHER): Payer: Self-pay | Admitting: *Deleted

## 2014-03-04 ENCOUNTER — Other Ambulatory Visit (INDEPENDENT_AMBULATORY_CARE_PROVIDER_SITE_OTHER): Payer: Self-pay | Admitting: *Deleted

## 2014-03-04 ENCOUNTER — Ambulatory Visit (HOSPITAL_COMMUNITY)
Admission: RE | Admit: 2014-03-04 | Discharge: 2014-03-04 | Disposition: A | Discharge status: Home / Self Care | Payer: BC Managed Care – PPO | Source: Ambulatory Visit | Attending: Pulmonary Disease | Admitting: Pulmonary Disease

## 2014-03-04 DIAGNOSIS — M549 Dorsalgia, unspecified: Secondary | ICD-10-CM

## 2014-03-04 DIAGNOSIS — K219 Gastro-esophageal reflux disease without esophagitis: Secondary | ICD-10-CM

## 2014-03-04 DIAGNOSIS — M545 Low back pain: Secondary | ICD-10-CM | POA: Diagnosis present

## 2014-03-05 ENCOUNTER — Other Ambulatory Visit: Payer: Self-pay

## 2014-03-11 ENCOUNTER — Encounter: Payer: Self-pay | Admitting: Obstetrics & Gynecology

## 2014-03-11 ENCOUNTER — Other Ambulatory Visit (HOSPITAL_COMMUNITY)
Admission: RE | Admit: 2014-03-11 | Discharge: 2014-03-11 | Disposition: A | Discharge status: Home / Self Care | Payer: BC Managed Care – PPO | Source: Ambulatory Visit | Attending: Obstetrics & Gynecology | Admitting: Obstetrics & Gynecology

## 2014-03-11 ENCOUNTER — Ambulatory Visit (INDEPENDENT_AMBULATORY_CARE_PROVIDER_SITE_OTHER): Payer: BC Managed Care – PPO | Admitting: Obstetrics & Gynecology

## 2014-03-11 VITALS — BP 130/88 | Ht 61.0 in | Wt 156.0 lb

## 2014-03-11 DIAGNOSIS — Z1151 Encounter for screening for human papillomavirus (HPV): Secondary | ICD-10-CM | POA: Diagnosis present

## 2014-03-11 DIAGNOSIS — Z01419 Encounter for gynecological examination (general) (routine) without abnormal findings: Secondary | ICD-10-CM | POA: Diagnosis present
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
## 2014-03-16 LAB — CYTOLOGY - PAP

## 2014-03-18 ENCOUNTER — Ambulatory Visit (HOSPITAL_COMMUNITY)
Admission: RE | Admit: 2014-03-18 | Payer: No Typology Code available for payment source | Source: Ambulatory Visit | Admitting: Internal Medicine

## 2014-03-18 ENCOUNTER — Encounter (HOSPITAL_COMMUNITY): Admission: RE | Payer: Self-pay | Source: Ambulatory Visit

## 2014-03-18 SURGERY — EGD (ESOPHAGOGASTRODUODENOSCOPY)
Anesthesia: Moderate Sedation

## 2014-03-26 ENCOUNTER — Other Ambulatory Visit: Payer: Self-pay | Admitting: Pulmonary Disease

## 2014-03-26 DIAGNOSIS — M5442 Lumbago with sciatica, left side: Secondary | ICD-10-CM

## 2014-03-30 ENCOUNTER — Other Ambulatory Visit: Payer: Self-pay | Admitting: Obstetrics & Gynecology

## 2014-03-30 MED ORDER — FLUCONAZOLE 100 MG PO TABS: 100.0000 mg | ORAL_TABLET | Freq: Every day | ORAL | Status: DC

## 2014-03-30 NOTE — Telephone Encounter (Signed)
Pt gets an oral thrush when she has to use a certain nebulizer therapy, wants diflucan instead of oral moutwash

## 2014-04-02 ENCOUNTER — Ambulatory Visit
Admission: RE | Admit: 2014-04-02 | Discharge: 2014-04-02 | Disposition: A | Discharge status: Home / Self Care | Payer: BC Managed Care – PPO | Source: Ambulatory Visit | Attending: Pulmonary Disease | Admitting: Pulmonary Disease

## 2014-04-02 DIAGNOSIS — M5442 Lumbago with sciatica, left side: Secondary | ICD-10-CM

## 2014-04-08 ENCOUNTER — Other Ambulatory Visit: Payer: Self-pay | Admitting: Pulmonary Disease

## 2014-04-08 DIAGNOSIS — M5136 Other intervertebral disc degeneration, lumbar region: Secondary | ICD-10-CM

## 2014-04-12 ENCOUNTER — Ambulatory Visit
Admission: RE | Admit: 2014-04-12 | Discharge: 2014-04-12 | Disposition: A | Discharge status: Home / Self Care | Payer: BC Managed Care – PPO | Source: Ambulatory Visit | Attending: Pulmonary Disease | Admitting: Pulmonary Disease

## 2014-04-12 DIAGNOSIS — M5136 Other intervertebral disc degeneration, lumbar region: Secondary | ICD-10-CM

## 2014-04-12 MED ORDER — IOHEXOL 180 MG/ML  SOLN
1.0000 mL | Freq: Once | INTRAMUSCULAR | Status: AC | PRN
  Administered 2014-04-12: 1 mL via EPIDURAL

## 2014-04-12 MED ORDER — METHYLPREDNISOLONE ACETATE 40 MG/ML INJ SUSP (RADIOLOG
120.0000 mg | Freq: Once | INTRAMUSCULAR | Status: AC
  Administered 2014-04-12: 120 mg via EPIDURAL

## 2014-04-12 NOTE — Discharge Instructions (Signed)

## 2014-04-13 ENCOUNTER — Encounter (INDEPENDENT_AMBULATORY_CARE_PROVIDER_SITE_OTHER): Payer: Self-pay | Admitting: *Deleted

## 2014-04-30 ENCOUNTER — Other Ambulatory Visit: Payer: Self-pay | Admitting: Pulmonary Disease

## 2014-04-30 DIAGNOSIS — M545 Low back pain: Secondary | ICD-10-CM

## 2014-05-05 ENCOUNTER — Encounter (INDEPENDENT_AMBULATORY_CARE_PROVIDER_SITE_OTHER): Payer: Self-pay

## 2014-05-17 ENCOUNTER — Ambulatory Visit
Admission: RE | Admit: 2014-05-17 | Discharge: 2014-05-17 | Disposition: A | Discharge status: Home / Self Care | Payer: BC Managed Care – PPO | Source: Ambulatory Visit | Attending: Pulmonary Disease | Admitting: Pulmonary Disease

## 2014-05-17 DIAGNOSIS — M545 Low back pain: Secondary | ICD-10-CM

## 2014-05-17 MED ORDER — METHYLPREDNISOLONE ACETATE 40 MG/ML INJ SUSP (RADIOLOG
120.0000 mg | Freq: Once | INTRAMUSCULAR | Status: AC
  Administered 2014-05-17: 120 mg via EPIDURAL

## 2014-05-17 MED ORDER — IOHEXOL 180 MG/ML  SOLN
1.0000 mL | Freq: Once | INTRAMUSCULAR | Status: AC | PRN
  Administered 2014-05-17: 1 mL via EPIDURAL

## 2014-05-25 ENCOUNTER — Other Ambulatory Visit (INDEPENDENT_AMBULATORY_CARE_PROVIDER_SITE_OTHER): Payer: Self-pay | Admitting: Internal Medicine

## 2014-06-16 ENCOUNTER — Other Ambulatory Visit (INDEPENDENT_AMBULATORY_CARE_PROVIDER_SITE_OTHER): Payer: Self-pay | Admitting: *Deleted

## 2014-06-16 DIAGNOSIS — K219 Gastro-esophageal reflux disease without esophagitis: Secondary | ICD-10-CM

## 2014-06-17 ENCOUNTER — Telehealth (INDEPENDENT_AMBULATORY_CARE_PROVIDER_SITE_OTHER): Payer: Self-pay | Admitting: *Deleted

## 2014-06-17 ENCOUNTER — Encounter (HOSPITAL_COMMUNITY)
Admission: RE | Disposition: A | Discharge status: Home / Self Care | Payer: Self-pay | Source: Ambulatory Visit | Attending: Internal Medicine

## 2014-06-17 ENCOUNTER — Ambulatory Visit (HOSPITAL_COMMUNITY)
Admission: RE | Admit: 2014-06-17 | Discharge: 2014-06-17 | Disposition: A | Discharge status: Home / Self Care | Payer: BC Managed Care – PPO | Source: Ambulatory Visit | Attending: Internal Medicine | Admitting: Internal Medicine

## 2014-06-17 ENCOUNTER — Encounter (HOSPITAL_COMMUNITY): Payer: Self-pay | Admitting: *Deleted

## 2014-06-17 DIAGNOSIS — K2960 Other gastritis without bleeding: Secondary | ICD-10-CM | POA: Insufficient documentation

## 2014-06-17 DIAGNOSIS — Z880 Allergy status to penicillin: Secondary | ICD-10-CM | POA: Diagnosis not present

## 2014-06-17 DIAGNOSIS — Z79899 Other long term (current) drug therapy: Secondary | ICD-10-CM | POA: Insufficient documentation

## 2014-06-17 DIAGNOSIS — K219 Gastro-esophageal reflux disease without esophagitis: Secondary | ICD-10-CM

## 2014-06-17 DIAGNOSIS — F419 Anxiety disorder, unspecified: Secondary | ICD-10-CM | POA: Diagnosis not present

## 2014-06-17 DIAGNOSIS — Z885 Allergy status to narcotic agent status: Secondary | ICD-10-CM | POA: Insufficient documentation

## 2014-06-17 DIAGNOSIS — K2950 Unspecified chronic gastritis without bleeding: Secondary | ICD-10-CM

## 2014-06-17 DIAGNOSIS — J45909 Unspecified asthma, uncomplicated: Secondary | ICD-10-CM | POA: Diagnosis not present

## 2014-06-17 HISTORY — PX: ESOPHAGOGASTRODUODENOSCOPY: SHX5428

## 2014-06-17 HISTORY — DX: Dysplasia of cervix uteri, unspecified: N87.9

## 2014-06-17 SURGERY — EGD (ESOPHAGOGASTRODUODENOSCOPY)
Anesthesia: Moderate Sedation

## 2014-06-17 MED ORDER — MIDAZOLAM HCL 5 MG/5ML IJ SOLN
INTRAMUSCULAR | Status: AC
  Filled 2014-06-17: qty 10

## 2014-06-17 MED ORDER — PROMETHAZINE HCL 25 MG/ML IJ SOLN
INTRAMUSCULAR | Status: AC
  Filled 2014-06-17: qty 1

## 2014-06-17 MED ORDER — SODIUM CHLORIDE 0.9 % IJ SOLN
INTRAMUSCULAR | Status: AC
  Filled 2014-06-17: qty 3

## 2014-06-17 MED ORDER — BUTAMBEN-TETRACAINE-BENZOCAINE 2-2-14 % EX AERO
INHALATION_SPRAY | CUTANEOUS | Status: DC | PRN
  Administered 2014-06-17: 1 via TOPICAL

## 2014-06-17 MED ORDER — SODIUM CHLORIDE 0.9 % IV SOLN
INTRAVENOUS | Status: DC
  Administered 2014-06-17: 09:00:00 via INTRAVENOUS

## 2014-06-17 MED ORDER — MEPERIDINE HCL 50 MG/ML IJ SOLN
INTRAMUSCULAR | Status: DC | PRN
  Administered 2014-06-17 (×4): 25 mg via INTRAVENOUS

## 2014-06-17 MED ORDER — MIDAZOLAM HCL 5 MG/5ML IJ SOLN
INTRAMUSCULAR | Status: AC
  Filled 2014-06-17: qty 5

## 2014-06-17 MED ORDER — MEPERIDINE HCL 50 MG/ML IJ SOLN
INTRAMUSCULAR | Status: AC
  Filled 2014-06-17: qty 1

## 2014-06-17 MED ORDER — STERILE WATER FOR IRRIGATION IR SOLN
Status: DC | PRN
  Administered 2014-06-17: 10:00:00

## 2014-06-17 MED ORDER — PROMETHAZINE HCL 25 MG/ML IJ SOLN
INTRAMUSCULAR | Status: DC | PRN
  Administered 2014-06-17 (×2): 12.5 mg via INTRAVENOUS

## 2014-06-17 MED ORDER — MIDAZOLAM HCL 5 MG/5ML IJ SOLN
INTRAMUSCULAR | Status: DC | PRN
  Administered 2014-06-17 (×6): 3 mg via INTRAVENOUS

## 2014-06-17 NOTE — H&P (Signed)
Heather Mata is an 39 y.o. female.   Chief Complaint: Heather Mata is here for EGD. HPI: Heather Mata is 39 year old Caucasian female who has chronic GERD. She's had symptoms about 6 years. She has heartburn regurgitation and she also has had hoarseness and she also seemed to get flareup with her asthma. She has been on Prilosec and Nexium these medications quit working after a while. She has been on pantoprazole since September 2015 until far she is doing. Because of chronicity of her symptoms EGD was recommended. She denies dysphagia abdominal pain or melena. She has had few episodes regurgitation and vomiting usually when she withdrew vigorous exercise. She's never had hematemesis.  Past Medical History  Diagnosis Date  . Asthma   . Anxiety   . GERD (gastroesophageal reflux disease)   . Cervical dysplasia     Past Surgical History  Procedure Laterality Date  . Leep      x 2  . Cholecystectomy      age 53, non functioning    Family History  Problem Relation Age of Onset  . Cancer Father     kidney  . Cancer Paternal Grandmother     brain   Social History:  reports that she has never smoked. She does not have any smokeless tobacco history on file. She reports that she drinks alcohol. She reports that she does not use illicit drugs.  Allergies:  Allergies  Allergen Reactions  . Codeine Nausea And Vomiting  . Penicillins Hives    Medications Prior to Admission  Medication Sig Dispense Refill  . albuterol-ipratropium (COMBIVENT) 18-103 MCG/ACT inhaler Inhale 2 puffs into the lungs every 6 (six) hours as needed for wheezing or shortness of breath.    . ALPRAZolam (XANAX) 1 MG tablet Take 1 mg by mouth 3 (three) times daily as needed for anxiety.    . Alum & Mag Hydroxide-Simeth (MAGIC MOUTHWASH) SOLN Swish and swallow 5 mLs 4 (four) times daily.    . benzonatate (TESSALON) 100 MG capsule Take by mouth 3 (three) times daily as needed for cough.    . budesonide-formoterol (SYMBICORT)  80-4.5 MCG/ACT inhaler Inhale 2 puffs into the lungs 2 (two) times daily.      . clonazePAM (KLONOPIN) 1 MG tablet Take 1 mg by mouth at bedtime.    . cyclobenzaprine (FLEXERIL) 10 MG tablet Take 10 mg by mouth at bedtime as needed for muscle spasms.     . DULERA 200-5 MCG/ACT AERO Inhale 2 puffs into the lungs 2 (two) times daily.   12  . ipratropium-albuterol (DUONEB) 0.5-2.5 (3) MG/3ML SOLN Take 3 mLs by nebulization every 6 (six) hours as needed (wheezing/shortness of breath).     . pantoprazole (PROTONIX) 40 MG tablet TAKE 1 TABLET (40 MG TOTAL) BY MOUTH 2 (TWO) TIMES DAILY. 60 tablet 3  . promethazine (PHENERGAN) 25 MG tablet Take 25 mg by mouth every 6 (six) hours as needed for nausea or vomiting.    . fluconazole (DIFLUCAN) 100 MG tablet Take 1 tablet (100 mg total) by mouth daily. (Heather Mata not taking: Reported on 06/03/2014) 7 tablet 2  . VIORELE 0.15-0.02/0.01 MG (21/5) tablet Take 1 tablet by mouth daily.  11    No results found for this or any previous visit (from the past 48 hour(s)). No results found.  ROS  Blood pressure 133/78, pulse 100, temperature 98.4 F (36.9 C), temperature source Oral, resp. rate 17, height 5' 1.5" (1.562 m), weight 150 lb (68.04 kg), last menstrual period 06/17/2014,  SpO2 100 %. Physical Exam  Constitutional: She appears well-developed and well-nourished.  HENT:  Mouth/Throat: Oropharynx is clear and moist.  Eyes: Conjunctivae are normal.  Neck: No thyromegaly present.  Cardiovascular: Normal rate, regular rhythm and normal heart sounds.   No murmur heard. Respiratory: Effort normal and breath sounds normal.  GI: Soft. She exhibits no distension and no mass. There is no tenderness.  Musculoskeletal: She exhibits no edema.  Lymphadenopathy:    She has no cervical adenopathy.  Neurological: She is alert.  Skin: Skin is warm and dry.     Assessment/Plan Chronic GERD. Diagnostic EGD.  REHMAN,NAJEEB U 06/17/2014, 9:36 AM

## 2014-06-17 NOTE — Discharge Instructions (Signed)
Resume usual medications and diet. Remember not to exercise within 2 hours of each meal but can walk. Raise head end bed by 4-6 inches. No driving for 24 hours. Physician will call with results of stool test when completed. Office visit in 6 months.   Go By [facility] today , ( brick building across from [facility] 2nd, floor) to pick up kit for H. Pylori Antigen Stool.  Follow instructions and return to lab as indicated.    Esophagogastroduodenoscopy Care After Refer to this sheet in the next few weeks. These instructions provide you with information on caring for yourself after your procedure. Your caregiver may also give you more specific instructions. Your treatment has been planned according to current medical practices, but problems sometimes occur. Call your caregiver if you have any problems or questions after your procedure.  HOME CARE INSTRUCTIONS  Do not eat or drink anything until the numbing medicine (local anesthetic) has worn off and your gag reflex has returned. You will know that the local anesthetic has worn off when you can swallow comfortably.  Do not drive for 12 hours after the procedure or as directed by your caregiver.  Only take medicines as directed by your caregiver. SEEK MEDICAL CARE IF:   You cannot stop coughing.  You are not urinating at all or less than usual. SEEK IMMEDIATE MEDICAL CARE IF:  You have difficulty swallowing.  You cannot eat or drink.  You have worsening throat or chest pain.  You have dizziness, lightheadedness, or you faint.  You have nausea or vomiting.  You have chills.  You have a fever.  You have severe abdominal pain.  You have black, tarry, or bloody stools. Document Released: 04/23/2012 Document Reviewed: 04/23/2012 [facility] Patient Information 2015 [place]. This information is not intended to replace advice given to you by your health care provider. Make sure you discuss any questions you have  with your health care provider.

## 2014-06-17 NOTE — Telephone Encounter (Signed)
Per Dr.Rehman the patient will need to have labs drawn. 

## 2014-06-17 NOTE — Op Note (Signed)
EGD PROCEDURE REPORT  PATIENT:  Heather Mata  MR#:  [id number] Birthdate:  October 29, 1975, 39 y.o., female Endoscopist:  Dr. Rogene Houston, MD Referred By:  Dr. Alonza Bogus, MD  Procedure Date: 06/17/2014  Procedure:   EGD  Indications: Patient is 39 year old Caucasian female who has chronic GERD and has failed omeprazole and Nexium. She is doing well with pantoprazole twice a day schedule. She is undergoing diagnostic EGD to make sure she does not have Barrett's esophagus, hernia or peptic ulcer disease.            Informed Consent:  The risks, benefits, alternatives & imponderables which include, but are not limited to, bleeding, infection, perforation, drug reaction and potential missed lesion have been reviewed.  The potential for biopsy, lesion removal, esophageal dilation, etc. have also been discussed.  Questions have been answered.  All parties agreeable.  Please see history & physical in medical record for more information.  Medications:  Demerol 100 mg IV Versed 18 mg IV Promethazine 25 mg IV and diluted form. Cetacaine spray topically for oropharyngeal anesthesia  Description of procedure:  The endoscope was introduced through the mouth and advanced to the second portion of the duodenum without difficulty or limitations. The mucosal surfaces were surveyed very carefully during advancement of the scope and upon withdrawal.  Findings:  Esophagus:  Mucosa of the esophagus was normal. GE junction was unremarkable. GEJ:  38 cm Stomach:  Stomach was empty and distended very well with insufflation. Folds in the proximal stomach were normal. Mucosa at gastric body was normal. Antral mucosa revealed granularity and patchy erythema but no erosions or ulcers are noted. Pyloric channel was patent. Angularis fundus and cardia were unremarkable. Duodenum:  Normal bulbar and post bulbar mucosa.  Therapeutic/Diagnostic Maneuvers Performed:  None  Complications:   None  Impression: Nonerosive antral gastritis otherwise normal EGD. No evidence of erosive esophagitis or Barrett's esophagus.  Recommendations:  Continue anti-reflex measures and pantoprazole at 40 mg by mouth twice a day. Patient advised to raise HOB by 4-6 inches. H. pylori stool antigen. Office visit in 6 months or earlier if symptoms relapse.  Majed Pellegrin U  06/17/2014  10:07 AM  CC: Dr. Alonza Bogus, MD & Dr. Rayne Du ref. provider found

## 2014-06-21 ENCOUNTER — Encounter (HOSPITAL_COMMUNITY): Payer: Self-pay | Admitting: Internal Medicine

## 2014-06-22 LAB — HELICOBACTER PYLORI  SPECIAL ANTIGEN: H. PYLORI ANTIGEN STOOL: NEGATIVE

## 2014-07-29 ENCOUNTER — Other Ambulatory Visit: Payer: Self-pay | Admitting: Obstetrics & Gynecology

## 2014-08-24 ENCOUNTER — Ambulatory Visit
Admit: 2014-08-24 | Disposition: A | Discharge status: Home / Self Care | Payer: Self-pay | Attending: Pain Medicine | Admitting: Pain Medicine

## 2014-10-07 ENCOUNTER — Other Ambulatory Visit (INDEPENDENT_AMBULATORY_CARE_PROVIDER_SITE_OTHER): Payer: Self-pay | Admitting: Internal Medicine

## 2014-10-08 ENCOUNTER — Other Ambulatory Visit: Payer: Self-pay | Admitting: Obstetrics & Gynecology

## 2014-10-08 MED ORDER — ACYCLOVIR 5 % EX OINT: 1.0000 "application " | TOPICAL_OINTMENT | CUTANEOUS | Status: DC

## 2014-10-08 MED ORDER — VALACYCLOVIR HCL 1 G PO TABS: ORAL_TABLET | ORAL | Status: DC

## 2014-10-08 NOTE — Telephone Encounter (Signed)
Pt requested oral valtrex instead of topical  2 grams x2 11 refills

## 2014-11-30 ENCOUNTER — Encounter (INDEPENDENT_AMBULATORY_CARE_PROVIDER_SITE_OTHER): Payer: Self-pay | Admitting: *Deleted

## 2014-12-15 ENCOUNTER — Ambulatory Visit (INDEPENDENT_AMBULATORY_CARE_PROVIDER_SITE_OTHER): Payer: BC Managed Care – PPO | Admitting: Internal Medicine

## 2014-12-31 ENCOUNTER — Other Ambulatory Visit: Payer: Self-pay | Admitting: Pulmonary Disease

## 2015-01-18 ENCOUNTER — Ambulatory Visit
Admission: RE | Admit: 2015-01-18 | Discharge: 2015-01-18 | Disposition: A | Discharge status: Home / Self Care | Payer: BC Managed Care – PPO | Source: Ambulatory Visit | Attending: Pulmonary Disease | Admitting: Pulmonary Disease

## 2015-01-18 ENCOUNTER — Other Ambulatory Visit: Payer: Self-pay | Admitting: Pulmonary Disease

## 2015-01-18 DIAGNOSIS — M542 Cervicalgia: Secondary | ICD-10-CM

## 2015-01-27 ENCOUNTER — Encounter: Payer: Self-pay | Admitting: Neurology

## 2015-01-27 ENCOUNTER — Other Ambulatory Visit: Payer: Self-pay | Admitting: Obstetrics & Gynecology

## 2015-01-27 ENCOUNTER — Ambulatory Visit (INDEPENDENT_AMBULATORY_CARE_PROVIDER_SITE_OTHER): Payer: BC Managed Care – PPO | Admitting: Neurology

## 2015-01-27 VITALS — BP 121/81 | HR 96 | Ht 61.0 in | Wt 175.5 lb

## 2015-01-27 DIAGNOSIS — G8929 Other chronic pain: Secondary | ICD-10-CM

## 2015-01-27 DIAGNOSIS — M545 Low back pain, unspecified: Secondary | ICD-10-CM

## 2015-01-27 HISTORY — DX: Other chronic pain: G89.29

## 2015-01-27 HISTORY — DX: Low back pain, unspecified: M54.50

## 2015-01-27 MED ORDER — MELOXICAM 15 MG PO TABS: 15.0000 mg | ORAL_TABLET | Freq: Every day | ORAL | Status: DC
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
## 2015-01-27 NOTE — Patient Instructions (Addendum)
   We will set you up for physical therapy and start Mobic as an antinflamatory. Start on the flexeril 10 mg 2 or 3 times a day.   Chronic Back Pain  When back pain lasts longer than 3 months, it is called chronic back pain.People with chronic back pain often go through certain periods that are more intense (flare-ups).  CAUSES Chronic back pain can be caused by wear and tear (degeneration) on different structures in your back. These structures include:  The bones of your spine (vertebrae) and the joints surrounding your spinal cord and nerve roots (facets).  The strong, fibrous tissues that connect your vertebrae (ligaments). Degeneration of these structures may result in pressure on your nerves. This can lead to constant pain. HOME CARE INSTRUCTIONS  Avoid bending, heavy lifting, prolonged sitting, and activities which make the problem worse.  Take brief periods of rest throughout the day to reduce your pain. Lying down or standing usually is better than sitting while you are resting.  Take over-the-counter or prescription medicines only as directed by your caregiver. SEEK IMMEDIATE MEDICAL CARE IF:   You have weakness or numbness in one of your legs or feet.  You have trouble controlling your bladder or bowels.  You have nausea, vomiting, abdominal pain, shortness of breath, or fainting. Document Released: 06/14/2004 Document Revised: 07/30/2011 Document Reviewed: 04/21/2011 [facility] Patient Information 2015 [place], [place]. This information is not intended to replace advice given to you by your health care provider. Make sure you discuss any questions you have with your health care provider.

## 2015-01-29 ENCOUNTER — Other Ambulatory Visit: Payer: Self-pay | Admitting: Obstetrics & Gynecology

## 2015-02-04 ENCOUNTER — Other Ambulatory Visit: Payer: Self-pay | Admitting: Obstetrics & Gynecology

## 2015-02-04 MED ORDER — AZITHROMYCIN 500 MG PO TABS: ORAL_TABLET | ORAL | Status: DC

## 2015-02-04 NOTE — Telephone Encounter (Signed)
Pt with history of significant asthma and bronchitis with sinus and ear issues, cough  Will use azithromycin

## 2015-03-04 ENCOUNTER — Encounter: Payer: Self-pay | Admitting: Rehabilitation

## 2015-03-04 ENCOUNTER — Ambulatory Visit: Payer: BC Managed Care – PPO | Attending: Neurology | Admitting: Rehabilitation

## 2015-03-04 DIAGNOSIS — M5442 Lumbago with sciatica, left side: Secondary | ICD-10-CM | POA: Diagnosis not present

## 2015-03-04 DIAGNOSIS — R531 Weakness: Secondary | ICD-10-CM | POA: Diagnosis present

## 2015-03-04 DIAGNOSIS — R6889 Other general symptoms and signs: Secondary | ICD-10-CM | POA: Insufficient documentation

## 2015-03-04 NOTE — Patient Instructions (Signed)
PELVIC STABILIZATION: Pelvic Tilt (Lying)    Exhaling, pull belly toward spine, tilting pelvis forward. Inhaling, release. Repeat _10__ times. Do _1__ times per day.  With same position, add marching, alternating legs, Do 10 on each side, resting pelvis in between.  1 time a day.   Copyright  VHI. All rights reserved.   Extension (Prone)    Lie prone,arms straight out and ahead of you. Lift chest and arms off floor. Repeat _10_ times.  Do _1_ sets per session.  Copyright  VHI. All rights reserved.   Gluteals (Prone)    Lie on stomach with R then L knee bent. Lift entire leg toward ceiling. Do not tilt pelvis. Do not allow back to arch. Hold _2___ seconds. Repeat _10___ times. Do __1__ sessions per day. CAUTION: Move slowly.  Copyright  VHI. All rights reserved.   DEVELOPMENTAL POSITION: Quadruped Alternate Shoulder Flexion / Hip Extension    Maintain trunk steady, raise arm and opposite leg at same time. _10__ reps per set, _1__ sets per day, _5_ days per week   Copyright  VHI. All rights reserved.
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
## 2015-03-18 ENCOUNTER — Ambulatory Visit: Payer: BC Managed Care – PPO | Admitting: Physical Therapy

## 2015-03-18 DIAGNOSIS — R531 Weakness: Secondary | ICD-10-CM

## 2015-03-18 DIAGNOSIS — M5442 Lumbago with sciatica, left side: Secondary | ICD-10-CM

## 2015-03-18 NOTE — Patient Instructions (Signed)
PELVIC STABILIZATION: Pelvic Tilt (Lying)    Exhaling, pull belly toward spine, tilting pelvis forward. Hold for 5 seconds. Inhaling, release.  Repeat _10__ times. Do _1__ times per day.  With same position, add marching, alternating legs, Do 10 on each side, resting pelvis in between. 1 time a day.       Gluteals (Prone)    Lie on stomach with R then L knee bent. Lift entire leg toward ceiling. Do not tilt pelvis. Do not allow back to arch. Hold _2___ seconds. Repeat _10___ times. Do __1__ sessions per day. CAUTION: Move slowly.  Copyright  VHI. All rights reserved.   DEVELOPMENTAL POSITION: Quadruped Alternate Shoulder Flexion / Hip Extension    Maintain trunk steady, raise arm and opposite leg at same time. _8_ reps per set, _1__ sets per day, _5_ days per week      Prone pressup with rotation  Lie on your stomach with your hands under your shoulders. Walk your hands slightly to the right.  Lift your chest, keeping your shoulders away from your ears and your elbows somewhat bent and rotate slightly to the RIGHT.  Hold press-up for 5 seconds. Relax. Repeat.  Do this 5 consecutive times when you have buttock pain.

## 2015-03-20 NOTE — Therapy (Signed)
[facility] [address] [place], [place], [address] Phone: [phone]   Fax:  [phone]  Physical Therapy Treatment  Patient Details  Name: Heather Mata MRN: [id number] Date of Birth: [birth year] Referring Provider: Margette Fast  Encounter Date: 03/18/2015      PT End of Session - 03/20/15 1203    Visit Number 2   Number of Visits --  eval + 8 visits   Date for PT Re-Evaluation 05/03/15   Authorization Type BCBS, no visit limit, no auth required   PT Start Time 1400   PT Stop Time 1446   PT Time Calculation (min) 46 min   Activity Tolerance Patient tolerated treatment well   Behavior During Therapy [facility] for tasks assessed/performed      Past Medical History  Diagnosis Date  . Asthma   . Anxiety   . GERD (gastroesophageal reflux disease)   . Cervical dysplasia   . Reflux     laryngotracheal   . Hypertension   . Cancer (HCC)     cervical dysplasia  . Chronic low back pain 01/27/2015    Past Surgical History  Procedure Laterality Date  . Leep      x 2  . Cholecystectomy      age 69, non functioning  . Esophagogastroduodenoscopy N/A 06/17/2014    Procedure: ESOPHAGOGASTRODUODENOSCOPY (EGD);  Surgeon: Rogene Houston, MD;  [facility];  Service: Endoscopy;  Laterality: N/A;  830 - moved to 1/28 @ 9:30 - Ann notified pt    There were no vitals filed for this visit.  Visit Diagnosis:  Midline low back pain with left-sided sciatica  Generalized weakness      Subjective Assessment - 03/19/15 1148    Subjective Pt reporting increased pain in lower back after car ride to PT clinic as well as prolonged walking with daughter. No LLE radicular pain at baseline (pre-session).   Limitations Sitting;Standing  for more than 45 min   Patient Stated Goals "To work out and have less pain"     Currently in Pain? Yes   Pain Score 6    Pain Location Back   Pain Orientation Mid;Lower;Left   Pain  Descriptors / Indicators Sharp;Stabbing;Throbbing   Pain Type Chronic pain   Pain Onset More than a month ago   Pain Frequency Constant   Aggravating Factors  prolonged sitting or standing   Pain Relieving Factors walking, stretching   Effect of Pain on Daily Activities "dragging" L foot, L knee buckling at times      Brief assessment of pain reveals the following: (+) L Slump Test. Repeated motions (x3): thoracolumbar flexion: no change; extension: no change. R lateral flexion: no change. L lateral flexion: increased concordant pain in L buttock. B rotation: no change.                   [place] Adult PT Treatment/Exercise - 03/19/15 1155    Self-Care   Self-Care Other Self-Care Comments   Other Self-Care Comments  Explained and utilized visual aid to explain centralization, peripheralization of symptoms with empahsis on goal of centralization, lumbar stabilization with verbal understanding from pt. Also explained role of core activation during functional activities/mobility with verbal understanding from pt.   Exercises   Exercises Other Exercises   Other Exercises  Reviewed HEP provided on evaluaton. Pt performed all exercises indendently without use of paper handout or cueing. With verbal/demonstration cueing from PT, pt also performed quadruped cat/camel x10  reps, prone press ups 5 x 5-sec holds with pt report of decreased LLE radicular pain post-exercise; prone press-up with R lateral flexion 5 x5-sec holds with pt report of complete dissipation of LLE radicular pain post-exercise. Supine, hook lying bracing x5-sec holds for transverse abdominus activation, lumbar stabilization x8 reps.                PT Education - 03/20/15 1152    Education provided Yes   Education Details Reviewed and progresed HEP.   Person(s) Educated Patient   Methods Explanation;Demonstration;Tactile cues;Verbal cues;Handout   Comprehension Verbalized understanding;Returned demonstration           PT Short Term Goals - 03/20/15 1204    PT SHORT TERM GOAL #1   Title Pt will initiate HEP for core strengthening and stretching in order to reduce pain with functional mobility (Target Date: 04/01/15)   Baseline Met 10/28.   Time 4   Period Weeks   Status Achieved   PT SHORT TERM GOAL #2   Title Pt will demonstrate ability to report 3 ways to decrease pain in work day to indicate self strategies learned from therapy sessions.  (Target Date: 04/01/15)   Time 4   Period Weeks   PT SHORT TERM GOAL #3   Title Pt will increase hip flex strength to 4+/5 to indicate improvement in functional mobility.  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 (Target Date: 04/01/15)           PT Long Term Goals - 03/04/15 1644    PT LONG TERM GOAL #1   Title Pt will be independent with HEP for core strengthening and stretching to reduce pain in low back.  (Target Date: 04/29/15)   Time 8   Period Weeks   PT LONG TERM GOAL #2   Title Pt will report less than or equal to 5/10 pain following work day in order to demonstrate improved functional mobility. (Target Date: 04/29/15)   Time 8   Period Weeks   PT LONG TERM GOAL #3   Title Pt will improve Modified Oswestry pain Scale score to greater than or equal to 40.8 to demonsrtate decreased in self reported pain during functional mobility. (Target Date: 04/29/15)   Time 8   Period Weeks   PT LONG TERM GOAL #4   Title Pt will independently demonstrate improved body mechanics and ergonomics with work station in order to decrease risk of injury and increased pain. (Target Date: 04/29/15)   Time 8   Period Weeks   PT LONG TERM GOAL #5   Title Pt will verbalize transition to community wellness program to indicate continued strengthening and  endurance to decrease pain with all functional mobility.  (Target Date: 04/29/15)   Time 8   Period Weeks               Plan - 03/20/15 1205    Clinical Impression Statement LLE radicular symptoms appear to centralize with thoracolumbar R lateral flexion, extension. Session focused on educating pt on role of extension-based exercise and core strengthening in long-term control of LLE symptoms, weakness. Pt very motivated and compliant with HEP, as exhibited by pt ability to independently perform entire HEP without use of paper handout during this session. Pt will  continue to benefit from outpatient PT to decrease distal symptoms and increase stability with/tolerance with functional mobility.    Pt will benefit from skilled therapeutic intervention in order to improve on the following deficits Decreased activity tolerance;Decreased balance;Decreased endurance;Decreased mobility;Decreased strength;Difficulty walking;Impaired perceived functional ability;Impaired flexibility;Impaired sensation;Postural dysfunction;Pain   Rehab Potential Good   PT Frequency 1x / week   PT Duration 8 weeks   PT Treatment/Interventions ADLs/Self Care Home Management;Electrical Stimulation;Moist Heat;Traction;Ultrasound;Gait training;Stair training;Functional mobility training;Therapeutic activities;Therapeutic exercise;Neuromuscular re-education;Patient/family education;Manual techniques   PT Next Visit Plan Look at gait for longer periods of time for possible L foot drag?, add piriformis stretch to HEP.  Ask about change in LLE symptoms with HEP. Progress lumbar stabilization program as pt improved. Consider multifidus strengthening.   Consulted and Agree with Plan of Care Patient        Problem List Patient Active Problem List   Diagnosis Date Noted  . Chronic low back pain 01/27/2015  . Unspecified asthma(493.90) 02/02/2014  . GERD (gastroesophageal reflux disease) 02/02/2014  . Asthma 02/05/2013     Billie Ruddy, PT, DPT [facility] [address] [place], [place], [address] Phone: [phone]   Fax:  [phone] 03/20/2015, 12:13 PM   Name: Heather Mata MRN: [id number] Date of Birth: 09-18-1975

## 2015-03-25 ENCOUNTER — Encounter: Payer: Self-pay | Admitting: Physical Therapy

## 2015-03-25 ENCOUNTER — Ambulatory Visit: Payer: BC Managed Care – PPO | Attending: Neurology | Admitting: Physical Therapy

## 2015-03-25 DIAGNOSIS — R531 Weakness: Secondary | ICD-10-CM | POA: Diagnosis present

## 2015-03-25 DIAGNOSIS — R6889 Other general symptoms and signs: Secondary | ICD-10-CM | POA: Insufficient documentation

## 2015-03-25 DIAGNOSIS — M5442 Lumbago with sciatica, left side: Secondary | ICD-10-CM

## 2015-03-25 NOTE — Therapy (Signed)
[facility] [address] [place], [place], [address] Phone: [phone]   Fax:  [phone]  Physical Therapy Treatment  Patient Details  Name: Heather Mata MRN: [id number] Date of Birth: 10/04/1975 Referring Provider: Margette Fast  Encounter Date: 03/25/2015      PT End of Session - 03/25/15 1622    Visit Number 3   Number of Visits --  eval + 8 visits   Date for PT Re-Evaluation 05/03/15   Authorization Type BCBS, no visit limit, no auth required   PT Start Time 0932   PT Stop Time 1615   PT Time Calculation (min) 45 min   Activity Tolerance Patient tolerated treatment well   Behavior During Therapy [facility] for tasks assessed/performed      Past Medical History  Diagnosis Date  . Asthma   . Anxiety   . GERD (gastroesophageal reflux disease)   . Cervical dysplasia   . Reflux     laryngotracheal   . Hypertension   . Cancer (HCC)     cervical dysplasia  . Chronic low back pain 01/27/2015    Past Surgical History  Procedure Laterality Date  . Leep      x 2  . Cholecystectomy      age 59, non functioning  . Esophagogastroduodenoscopy N/A 06/17/2014    Procedure: ESOPHAGOGASTRODUODENOSCOPY (EGD);  Surgeon: Rogene Houston, MD;  [facility];  Service: Endoscopy;  Laterality: N/A;  830 - moved to 1/28 @ 9:30 - Ann notified pt    There were no vitals filed for this visit.  Visit Diagnosis:  No diagnosis found.      Subjective Assessment - 03/25/15 1630    Subjective Pt. 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     Therapeutic exercise: To strengthen the core muscles to stabilized spine to reduce the pt's pain.  Mat exercises: Pt supervision. Cues for proper form with exercises. Posterior pelvic tilts. 1 set X10 reps with 3 sec holds to educate on TA and pelvic floor contraction. Quadruped pelvic stabilization to  help pt find neutral pelvis. Quadruped with alternating UE and LE raises. 1 set X10 each side. Supine red physio ball pass from lifted LE to UE. 2 sets X12 reps (modified "V" sit) Forearm planks. 3 sets, 30sec, 30sec, 45sec. Cues to keep back flat. Pt corrected after cues.  Bridges with alternating leg raises. Cues for TA and pelvic floor contraction before lifting pelvis off of mat.  Stretches: For pain reduction and centralization of shooting pain. Pt supervision. Cues for proper form.  Downward dog to pigeon stretch. 1 set, X5 reps each side. Prone extension on hands to child's pose. 1 set X5 reps. Supine piriformis stretch with one leg crossed over opposite bent knee with UE pull. 1, 30 sec. Hold each side. Prone extension with rotation to the right. 3, 10sec holds.          PT Education - 03/25/15 1613    Education provided Yes   Education Details Pt educated on gym program to promote strengthening, but also to promote safety. Pt. advised to warm up core muscles first before UE and LE strengthening to stabilize spine. Pt. also advised to focus on quality of exercise reps, and to not push through pain and bad form as this  could cause a set back in therapy.   Person(s) Educated Patient  Methods Explanation   Comprehension Verbalized understanding          PT Short Term Goals - 03/25/15 1610    PT SHORT TERM GOAL #1   Title Pt will initiate HEP for core strengthening and stretching in order to reduce pain with functional mobility (Target Date: 04/01/15)   Baseline Met 10/28.   Time 4   Period Weeks   Status Achieved   PT SHORT TERM GOAL #2   Title Pt will demonstrate ability to report 3 ways to decrease pain in work day to indicate self strategies learned from therapy sessions.  (Target Date: 04/01/15)   Time 4   Period Weeks   Status On-going   PT SHORT TERM GOAL #3   Title Pt will increase hip flex strength to 4+/5 to indicate improvement in functional mobility.   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 (Target Date: 04/01/15)   Status On-going           PT Long Term Goals - 03/04/15 1644    PT LONG TERM GOAL #1   Title Pt will be independent with HEP for core strengthening and stretching to reduce pain in low back.  (Target Date: 04/29/15)   Time 8   Period Weeks   PT LONG TERM GOAL #2   Title Pt will report less than or equal to 5/10 pain following work day in order to demonstrate improved functional mobility. (Target Date: 04/29/15)   Time 8   Period Weeks   PT LONG TERM GOAL #3   Title Pt will improve Modified Oswestry pain Scale score to greater than or equal to 40.8 to demonsrtate decreased in self reported pain during functional mobility. (Target Date: 04/29/15)   Time 8   Period Weeks   PT LONG TERM GOAL #4   Title Pt will independently demonstrate improved body mechanics and ergonomics with work station in order to decrease risk of injury and increased pain. (Target Date: 04/29/15)   Time 8   Period Weeks   PT LONG TERM GOAL #5   Title Pt will verbalize transition to community wellness program to indicate continued strengthening and endurance to decrease pain with all functional mobility.  (Target Date: 04/29/15)   Time 8   Period Weeks           Plan - 03/25/15 1623    Clinical Impression Statement Pt. presents with enough core strength to hold a 45sec forearm plank. Pt. advised not to overdue exercises. Pt. progressing toward remaining STG's. Pt's pain increased from 3/10 to 5/10 during treatment with no shooting pain.    Pt will benefit from skilled therapeutic intervention in order to improve on the following deficits Decreased activity tolerance;Decreased balance;Decreased endurance;Decreased  mobility;Decreased strength;Difficulty walking;Impaired perceived functional ability;Impaired flexibility;Impaired sensation;Postural dysfunction;Pain   Rehab Potential Good   PT Frequency 1x / week   PT Duration 8 weeks   PT Treatment/Interventions ADLs/Self Care Home Management;Electrical Stimulation;Moist Heat;Traction;Ultrasound;Gait training;Stair training;Functional mobility training;Therapeutic activities;Therapeutic exercise;Neuromuscular re-education;Patient/family education;Manual techniques   PT Next Visit Plan Introduce supine on pool noodle stretch with pelvic rotation. Also introduce selective Yoga poses for multi position stability for core strengthening and pain management.   Consulted and Agree with Plan of Care Patient  Problem List Patient Active Problem List   Diagnosis Date Noted  . Chronic low back pain 01/27/2015  . Unspecified asthma(493.90) 02/02/2014  . GERD (gastroesophageal reflux disease) 02/02/2014  . Asthma 02/05/2013    Laney Potash 03/25/2015, 4:31 PM  Laney Potash, SPTA  Name: Heather Mata MRN: [id number] Date of Birth: 06/05/1975   This note has been reviewed and edited by supervision CI.  Willow Ora, PTA, [place] [address], [place], [place] [address] [phone] 03/26/2015, 11:48 PM

## 2015-04-01 ENCOUNTER — Encounter: Payer: Self-pay | Admitting: Physical Therapy

## 2015-04-01 ENCOUNTER — Ambulatory Visit: Payer: BC Managed Care – PPO | Admitting: Physical Therapy

## 2015-04-01 DIAGNOSIS — M5442 Lumbago with sciatica, left side: Secondary | ICD-10-CM

## 2015-04-01 DIAGNOSIS — R6889 Other general symptoms and signs: Secondary | ICD-10-CM

## 2015-04-01 DIAGNOSIS — R531 Weakness: Secondary | ICD-10-CM

## 2015-04-01 NOTE — Therapy (Signed)
[facility] [address] [place], [place], [address] Phone: [phone]   Fax:  [phone]  Physical Therapy Treatment  Patient Details  Name: Heather Mata MRN: [id number] Date of Birth: 06-Mar-1976 Referring Provider: Margette Fast  Encounter Date: 04/01/2015      PT End of Session - 04/01/15 1626    Visit Number 4   Number of Visits --  eval + 8 visits   Date for PT Re-Evaluation 05/03/15   Authorization Type BCBS, no visit limit, no auth required   PT Start Time 2542   PT Stop Time 1615   PT Time Calculation (min) 45 min   Activity Tolerance Patient tolerated treatment well   Behavior During Therapy [facility] for tasks assessed/performed      Past Medical History  Diagnosis Date  . Asthma   . Anxiety   . GERD (gastroesophageal reflux disease)   . Cervical dysplasia   . Reflux     laryngotracheal   . Hypertension   . Cancer (HCC)     cervical dysplasia  . Chronic low back pain 01/27/2015    Past Surgical History  Procedure Laterality Date  . Leep      x 2  . Cholecystectomy      age 68, non functioning  . Esophagogastroduodenoscopy N/A 06/17/2014    Procedure: ESOPHAGOGASTRODUODENOSCOPY (EGD);  Surgeon: Rogene Houston, MD;  [facility];  Service: Endoscopy;  Laterality: N/A;  830 - moved to 1/28 @ 9:30 - Ann notified pt    There were no vitals filed for this visit.  Visit Diagnosis:  Midline low back pain with left-sided sciatica  Generalized weakness  Decreased functional activity tolerance      Subjective Assessment - 04/01/15 1533    Subjective Pt reports 4/10 pain upon arrival after shopping for 3 hours. Pt took pain meds earlier in the day. She reported that pain was still 4/10 in low back at the end of treatment. She reports going to the gym 3 days per week and adhering to HEP .   Limitations Sitting;Standing   Patient Stated Goals "To work out and have less pain"     Currently in Pain? Yes   Pain Score 4    Pain Orientation Left;Mid;Lower   Pain Descriptors / Indicators Sharp;Stabbing;Throbbing   Pain Type Chronic pain   Pain Onset More than a month ago   Aggravating Factors  prolonged sitting and standing   Pain Relieving Factors walking, stretching       Therapeutic exercise: To strengthen core and hip muscles and increase spinal mobility in order to stabilize spine and decrease pain. Pt supervision with cues for sequencing and proper form.  Forearm planks: 2, 30sec holds. Blue physioball ball pass from UE to LE with ab crunch: 1 set X15 reps. Red physioball roll out with bridge: 2 sets, X8 reps. Red physioball prone with ball at pelvis area:  upper body extension into modified "superman": 2 sets, X10 reps. Yoga venosa to warrior one: 1 rep each side. Yoga venosa to reverse warrior: 1 rep each side.       PT Education - 04/01/15 1625    Education provided Yes   Education Details Pt educated on yoga poses to promote increased strength, core stability, and spinal mobility.   Person(s) Educated Patient   Methods Explanation;Demonstration   Comprehension Verbalized understanding;Returned demonstration          PT Short Term Goals - 04/01/15 1630  PT SHORT TERM GOAL #1   Title Pt will initiate HEP for core strengthening and stretching in order to reduce pain with functional mobility (Target Date: 04/01/15)   Baseline Met 10/28.   Time 4   Period Weeks   Status Achieved   PT SHORT TERM GOAL #2   Title Pt will demonstrate ability to report 3 ways to decrease pain in work day to indicate self strategies learned from therapy sessions.  (Target Date: 04/01/15)   Time 4   Period Weeks   Status On-going   PT SHORT TERM GOAL #3   Title Pt will increase hip flex strength to 4+/5 to indicate improvement in functional mobility.  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 (Target Date: 04/01/15)   Status On-going           PT Long Term Goals - 04/01/15 1631    PT LONG TERM GOAL #1   Title Pt will be independent with HEP for core strengthening and stretching to reduce pain in low back.  (Target Date: 04/29/15)   Time 8   Period Weeks   PT LONG TERM GOAL #2   Title Pt will report less than or equal to 5/10 pain following work day in order to demonstrate improved functional mobility. (Target Date: 04/29/15)   Time 8   Period Weeks   PT LONG TERM GOAL #3   Title Pt will improve Modified Oswestry pain Scale score to greater than or equal to 40.8 to demonsrtate decreased in self reported pain during functional mobility. (Target Date: 04/29/15)   Time 8   Period Weeks   PT LONG TERM GOAL #4   Title Pt will independently demonstrate improved body mechanics and ergonomics with work station in order to decrease risk of injury and increased pain. (Target Date: 04/29/15)   Time 8   Period Weeks   PT LONG TERM GOAL #5   Title Pt will verbalize transition to community wellness program to indicate continued strengthening and endurance to decrease pain with all functional mobility.  (Target Date: 04/29/15)   Time 8   Period Weeks           Plan - 04/01/15 1628    Clinical Impression Statement Pt presents with decreased core strength during 30 sec forearm plank. Pt progressed to core exercises on physioball and yoga poses to aid in achieving goals. Remaining STG's will be assessed next visit.   Pt will benefit from skilled therapeutic intervention in order to improve on the following deficits Decreased activity tolerance;Decreased balance;Decreased endurance;Decreased mobility;Decreased strength;Difficulty walking;Impaired perceived functional ability;Impaired flexibility;Impaired sensation;Postural  dysfunction;Pain   Rehab Potential Good   PT Frequency 1x / week   PT Duration 8 weeks   PT Treatment/Interventions ADLs/Self Care Home Management;Electrical Stimulation;Moist Heat;Traction;Ultrasound;Gait training;Stair training;Functional mobility training;Therapeutic activities;Therapeutic exercise;Neuromuscular re-education;Patient/family education;Manual techniques   PT Next Visit Plan Assess STG's. Continue physioball and yoga poses. Update HEP.    Consulted and Agree with Plan of Care Patient        Problem List Patient Active Problem List   Diagnosis Date Noted  . Chronic low back pain 01/27/2015  . Unspecified asthma(493.90) 02/02/2014  . GERD (gastroesophageal reflux disease) 02/02/2014  . Asthma 02/05/2013    [address]  Elynore Dolinski 04/01/2015, 4:35 PM  Laney Potash, [place]  Name: Heather Mata MRN: [id number] Date of Birth: 09/01/1975  This note has been reviewed and edited by supervising CI.  Willow Ora, PTA, [place] [address], [place], [place] [address] [phone] 04/02/2015, 10:32 PM

## 2015-04-08 ENCOUNTER — Other Ambulatory Visit (HOSPITAL_COMMUNITY)
Admission: RE | Admit: 2015-04-08 | Discharge: 2015-04-08 | Disposition: A | Discharge status: Home / Self Care | Payer: BC Managed Care – PPO | Source: Ambulatory Visit | Attending: Obstetrics and Gynecology | Admitting: Obstetrics and Gynecology

## 2015-04-08 ENCOUNTER — Ambulatory Visit (INDEPENDENT_AMBULATORY_CARE_PROVIDER_SITE_OTHER): Payer: BC Managed Care – PPO | Admitting: Adult Health

## 2015-04-08 ENCOUNTER — Encounter: Payer: Self-pay | Admitting: Adult Health

## 2015-04-08 ENCOUNTER — Ambulatory Visit: Payer: BC Managed Care – PPO | Admitting: Physical Therapy

## 2015-04-08 VITALS — BP 120/82 | HR 88 | Ht 61.0 in | Wt 183.0 lb

## 2015-04-08 DIAGNOSIS — N9489 Other specified conditions associated with female genital organs and menstrual cycle: Secondary | ICD-10-CM

## 2015-04-08 DIAGNOSIS — R140 Abdominal distension (gaseous): Secondary | ICD-10-CM

## 2015-04-08 DIAGNOSIS — Z01419 Encounter for gynecological examination (general) (routine) without abnormal findings: Secondary | ICD-10-CM

## 2015-04-08 DIAGNOSIS — N949 Unspecified condition associated with female genital organs and menstrual cycle: Secondary | ICD-10-CM

## 2015-04-08 DIAGNOSIS — Z87410 Personal history of cervical dysplasia: Secondary | ICD-10-CM

## 2015-04-08 HISTORY — DX: Other specified conditions associated with female genital organs and menstrual cycle: R14.0

## 2015-04-08 HISTORY — DX: Personal history of cervical dysplasia: Z87.410

## 2015-04-08 HISTORY — DX: Other specified conditions associated with female genital organs and menstrual cycle: N94.89

## 2015-04-08 NOTE — Progress Notes (Signed)
Patient ID: Heather Mata, female   DOB: Aug 29, 1975, 39 y.o.   MRN: [id number] History of Present Illness: Heather Mata is a 39 year old white female,single,G4P1 in for a well woman gyn exam and pap,she has a history of cervical dysplasia and has had LEEP x 2.She complains of bloating before her periods and uses midol with relief.Had normal pap with negative HPV last year but wants pap this too.She had flu shot 03/22/15. PCP is Dr Luan Pulling and she sees Dr Karsten Ro has chronic back pain from [place] 02/2014.   Current Medications, Allergies, Past Medical History, Past Surgical History, Family History and Social History were reviewed in [employer] record.     Review of Systems: Patient denies any headaches, hearing loss, fatigue, blurred vision, shortness of breath, chest pain, abdominal pain, problems with bowel movements, urination, or intercourse(not having sex). No joint pain or mood swings.See HPI for positives.    Physical Exam:BP 120/82 mmHg  Pulse 88  Ht 5\' 1"  (1.549 m)  Wt 183 lb (83.008 kg)  BMI 34.60 kg/m2  LMP 03/15/2015 General:  Well developed, well nourished, no acute distress Skin:  Warm and dry Neck:  Midline trachea, normal thyroid, good ROM, no lymphadenopathy Lungs; Clear to auscultation bilaterally Breast:  No dominant palpable mass, retraction, or nipple discharge Cardiovascular: Regular rate and rhythm Abdomen:  Soft, non tender, no hepatosplenomegaly Pelvic:  External genitalia is normal in appearance, no lesions.  The vagina is normal in appearance. Urethra has no lesions or masses. The cervix is bulbous,pap performed.  Uterus is felt to be normal size, shape, and contour.  No adnexal masses or tenderness noted.Bladder is non tender, no masses felt. Extremities/musculoskeletal:  No swelling or varicosities noted, no clubbing or cyanosis Psych:  No mood changes, alert and cooperative,seems happy Discussed tubal and ablation or OCs,gave handout  on ablation to review.  Impression: Well woman gyn exam with pap History of cervical dysplasia Bloating before periods    Plan: Physical in 1 year Mammogram at 39 Lab with PCP

## 2015-04-08 NOTE — Patient Instructions (Signed)
Physical in  1 year Mammogram at 41 

## 2015-04-11 ENCOUNTER — Telehealth: Payer: Self-pay | Admitting: *Deleted

## 2015-04-11 ENCOUNTER — Telehealth: Payer: Self-pay | Admitting: Adult Health

## 2015-04-11 MED ORDER — PHENTERMINE HCL 37.5 MG PO CAPS: 37.5000 mg | ORAL_CAPSULE | ORAL | Status: DC

## 2015-04-11 NOTE — Telephone Encounter (Signed)
Pt called and decided not to try adipex due to cost and side effects,

## 2015-04-11 NOTE — Telephone Encounter (Signed)
Wants to try adipex,will rx and follow up in 4 weeks

## 2015-04-12 LAB — CYTOLOGY - PAP

## 2015-04-13 ENCOUNTER — Ambulatory Visit: Payer: BC Managed Care – PPO | Admitting: Physical Therapy

## 2015-04-13 DIAGNOSIS — R531 Weakness: Secondary | ICD-10-CM

## 2015-04-13 DIAGNOSIS — R6889 Other general symptoms and signs: Secondary | ICD-10-CM

## 2015-04-13 DIAGNOSIS — M5442 Lumbago with sciatica, left side: Secondary | ICD-10-CM

## 2015-04-13 NOTE — Patient Instructions (Addendum)
SCIATIC NERVE GLIDE - SUPINE    Start by lying on your back and holding the back of your knee. Next, attempt to straighten your knee. Lastly, hold this position and then bend your ankle forward and back as shown.   Perform 20 reps.    SCIATIC NERVE GLIDE - SEATED - 2    Start by sitting flexed forward at your trunk and head as shown. Next, extend your knee as you extend your spine and head into a straight seated posture. Then return to starting position and repeat.  Perform 10 reps, 2 times per day on the LEFT leg.        PIRIFORMIS AND HIP STRETCH - SEATED    While sitting in a chair, cross your LEFT leg on top of the other as shown.   Next, gently lean forward until a  gentle stretch is felt in your LEFT buttock.  This should not be painful.   Hold for 30 seconds, 4 times.  Do this twice per day and/or when you have left buttock pain.

## 2015-04-14 NOTE — Therapy (Signed)
[facility] [address] [place], [place], [address] Phone: [phone]   Fax:  [phone]  Physical Therapy Treatment  Patient Details  Name: Heather Mata MRN: [id number] Date of Birth: 19-Dec-1975 Referring Provider: Margette Fast  Encounter Date: 04/13/2015      PT End of Session - 04/13/15 1444    Visit Number 5   Number of Visits 9   Date for PT Re-Evaluation 05/03/15   Authorization Type BCBS, no visit limit, no auth required   PT Start Time 0854   PT Stop Time 0930   PT Time Calculation (min) 36 min   Activity Tolerance Patient tolerated treatment well;No increased pain   Behavior During Therapy [facility] for tasks assessed/performed      Past Medical History  Diagnosis Date  . Asthma   . Anxiety   . GERD (gastroesophageal reflux disease)   . Cervical dysplasia   . Reflux     laryngotracheal   . Hypertension   . Cancer (HCC)     cervical dysplasia  . Chronic low back pain 01/27/2015  . Vaginal Pap smear, abnormal   . History of cervical dysplasia 04/08/2015    Had LEEP 2 x  . Abdominal bloating associated with menstruation 04/08/2015    Past Surgical History  Procedure Laterality Date  . Leep      x 2  . Cholecystectomy      age 55, non functioning  . Esophagogastroduodenoscopy N/A 06/17/2014    Procedure: ESOPHAGOGASTRODUODENOSCOPY (EGD);  Surgeon: Rogene Houston, MD;  [facility];  Service: Endoscopy;  Laterality: N/A;  830 - moved to 1/28 @ 9:30 - Ann notified pt    There were no vitals filed for this visit.  Visit Diagnosis:  Midline low back pain with left-sided sciatica  Generalized weakness  Decreased functional activity tolerance      Subjective Assessment - 04/13/15 0858    Subjective Pt reports no pain in back, no pain in leg at this time. Pt states, "I think the PT is helping; I am seeing the benefits. I think I just need to keep doing the exercises." Pt was able to  tolerate participating in a full Zumba class this week.   Limitations Sitting;Standing   Patient Stated Goals "To work out and have less pain"     Currently in Pain? No/denies                                 PT Education - 04/13/15 1444    Education provided Yes   Education Details HEP: added sciatic nerve glides and seated piriformis stretch. Discussed sitting position in chair at work with focus on avoiding posterior pelvic tilt, as this increases pt's discomfort.   Person(s) Educated Patient   Methods Explanation;Demonstration;Verbal cues;Handout   Comprehension Verbalized understanding;Returned demonstration          PT Short Term Goals - 04/13/15 0859    PT SHORT TERM GOAL #1   Title Pt will initiate HEP for core strengthening and stretching in order to reduce pain with functional mobility (Target Date: 04/01/15)   Baseline Met 10/28.   Time 4   Period Weeks   Status Achieved   PT SHORT TERM GOAL #2   Title Pt will demonstrate ability to report 3 ways to decrease pain in work day to indicate self strategies learned from therapy sessions.  (Target Date: 04/01/15)  Baseline Achieved 11/13: pain mitigated at work by activating core musculature during mobility, using tennis ball for self-MFR at L piriformis, and stetching L piriformis.   Time 4   Period Weeks   Status Achieved   PT SHORT TERM GOAL #3   Title Pt will increase hip flex strength to 4+/5 to indicate improvement in functional mobility.  (Target Date: 04/01/15)   Baseline Achieved 11/23.   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 (Target Date: 04/01/15)   Baseline Met 11/23.   Status Achieved           PT Long Term Goals - 04/01/15 1631    PT LONG TERM GOAL #1   Title Pt will be  independent with HEP for core strengthening and stretching to reduce pain in low back.  (Target Date: 04/29/15)   Time 8   Period Weeks   PT LONG TERM GOAL #2   Title Pt will report less than or equal to 5/10 pain following work day in order to demonstrate improved functional mobility. (Target Date: 04/29/15)   Time 8   Period Weeks   PT LONG TERM GOAL #3   Title Pt will improve Modified Oswestry pain Scale score to greater than or equal to 40.8 to demonsrtate decreased in self reported pain during functional mobility. (Target Date: 04/29/15)   Time 8   Period Weeks   PT LONG TERM GOAL #4   Title Pt will independently demonstrate improved body mechanics and ergonomics with work station in order to decrease risk of injury and increased pain. (Target Date: 04/29/15)   Time 8   Period Weeks   PT LONG TERM GOAL #5   Title Pt will verbalize transition to community wellness program to indicate continued strengthening and endurance to decrease pain with all functional mobility.  (Target Date: 04/29/15)   Time 8   Period Weeks               Plan - 04/14/15 1150    Clinical Impression Statement Pt met 4 of 4 STG's, indicating improved walking tolerance without L foot drop, increased L hip flexor strength, and ability to self-manage pain at work. Pt will continue to benefit from skilled outpatient PT to progress lumbar stabilization program, increase activity tolerance, and improve QoL.    Pt will benefit from skilled therapeutic intervention in order to improve on the following deficits Decreased activity tolerance;Decreased balance;Decreased endurance;Decreased mobility;Decreased strength;Difficulty walking;Impaired perceived functional ability;Impaired flexibility;Impaired sensation;Postural dysfunction;Pain   Rehab Potential Good   PT Frequency 1x / week   PT Duration 8 weeks   PT Treatment/Interventions ADLs/Self Care Home Management;Electrical Stimulation;Moist Heat;Traction;Ultrasound;Gait  training;Stair training;Functional mobility training;Therapeutic activities;Therapeutic exercise;Neuromuscular re-education;Patient/family education;Manual techniques   PT Next Visit Plan Teach pt to contract, isolate lumbar multifidus in prone then progress to sidelying > seated.   Consulted and Agree with Plan of Care Patient        Problem List Patient Active Problem List   Diagnosis Date Noted  . History of cervical dysplasia 04/08/2015  . Abdominal bloating associated with menstruation 04/08/2015  . Chronic low back pain 01/27/2015  . Unspecified asthma(493.90) 02/02/2014  . GERD (gastroesophageal reflux disease) 02/02/2014  . Asthma 02/05/2013    Billie Ruddy, PT, DPT [facility]  [address] [place], [place], [address] Phone: [phone]   Fax:  [phone] 04/14/2015, 12:00 PM   Name: Heather Mata MRN: [id number] Date of Birth: 1976-02-22

## 2015-04-21 ENCOUNTER — Ambulatory Visit: Payer: BC Managed Care – PPO | Attending: Neurology | Admitting: Rehabilitation

## 2015-04-21 ENCOUNTER — Encounter: Payer: Self-pay | Admitting: Rehabilitation

## 2015-04-21 DIAGNOSIS — M5442 Lumbago with sciatica, left side: Secondary | ICD-10-CM | POA: Diagnosis present

## 2015-04-21 DIAGNOSIS — R6889 Other general symptoms and signs: Secondary | ICD-10-CM | POA: Insufficient documentation

## 2015-04-21 DIAGNOSIS — R531 Weakness: Secondary | ICD-10-CM | POA: Insufficient documentation

## 2015-04-21 NOTE — Therapy (Signed)
[facility] [address] [place], [place], [address] Phone: [phone]   Fax:  [phone]  Physical Therapy Treatment and D/C Summary  Patient Details  Name: Heather Mata MRN: [id number] Date of Birth: November 26, 1975 Referring Provider: Margette Fast  Encounter Date: 04/21/2015      PT End of Session - 04/21/15 0805    Visit Number 6   Number of Visits 9   Date for PT Re-Evaluation 05/03/15   Authorization Type BCBS, no visit limit, no auth required   PT Start Time 0800   PT Stop Time 0842   PT Time Calculation (min) 42 min   Activity Tolerance Patient tolerated treatment well;No increased pain   Behavior During Therapy [facility] for tasks assessed/performed      Past Medical History  Diagnosis Date  . Asthma   . Anxiety   . GERD (gastroesophageal reflux disease)   . Cervical dysplasia   . Reflux     laryngotracheal   . Hypertension   . Cancer (HCC)     cervical dysplasia  . Chronic low back pain 01/27/2015  . Vaginal Pap smear, abnormal   . History of cervical dysplasia 04/08/2015    Had LEEP 2 x  . Abdominal bloating associated with menstruation 04/08/2015    Past Surgical History  Procedure Laterality Date  . Leep      x 2  . Cholecystectomy      age 52, non functioning  . Esophagogastroduodenoscopy N/A 06/17/2014    Procedure: ESOPHAGOGASTRODUODENOSCOPY (EGD);  Surgeon: Rogene Houston, MD;  [facility];  Service: Endoscopy;  Laterality: N/A;  830 - moved to 1/28 @ 9:30 - Ann notified pt    There were no vitals filed for this visit.  Visit Diagnosis:  Generalized weakness  Decreased functional activity tolerance  Midline low back pain with left-sided sciatica      Subjective Assessment - 04/21/15 0803    Subjective "I'm doing okay, I'm going to classes for Zumba and it starts rough but gets better as I continue to do it."    Limitations Sitting;Standing   Patient Stated Goals "To  work out and have less pain"     Currently in Pain? Yes   Pain Score 7    Pain Location Back   Pain Orientation Lower;Left   Pain Descriptors / Indicators Sharp;Throbbing   Pain Type Chronic pain   Pain Onset More than a month ago   Pain Frequency Constant   Aggravating Factors  prolonged sitting and standing   Pain Relieving Factors walking and stretching             Self Care: Discussed DC today as pt is making great progress with therapy.  Pt verbalized understanding. Discussed continued community fitness and pt states that she is currently doing Zumba class and provided recommendation for Yoga (gentle).  Pt verbalized understanding and agrees that she would like to get back to yoga as she feels much better following yoga session.  Also discussed HEP and compliance with this.  Pt able to demonstrate all exercises and states that she even performs them at work to alleviate pain as needed.  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  Pt able to verbalize and demonstrate good understanding of increasing anterior pelvic tilt when sitting to prevent low back pain.  Also had pt re-perform modified oswestry on FOTO to assess pts back pain as  related to her function.  Note improvement from 28 to 22%, however she did not meet her goal.  Pt with good understanding that she will have some degree of back pain for a while and that continued exercise and stretching will continue to improve this over time.    Therex: Performed all HEP from pt instruction;  Prone alternating arm and leg progressing to quadruped alternating arm and leg x 10 reps each, prone press ups with rotation to alleviate buttock pain x 1 min hold, sciatic nerve glides in supine and sitting x 20 reps each, seated piriformis stretch x 60 secs, posterior pelvic tilts x 10 reps with 5 sec hold x 10 reps.                      PT Education - 04/21/15 1158     Education provided Yes   Education Details Education on continued community fitness   Person(s) Educated Patient   Methods Explanation   Comprehension Verbalized understanding          PT Short Term Goals - 04/13/15 0859    PT SHORT TERM GOAL #1   Title Pt will initiate HEP for core strengthening and stretching in order to reduce pain with functional mobility (Target Date: 04/01/15)   Baseline Met 10/28.   Time 4   Period Weeks   Status Achieved   PT SHORT TERM GOAL #2   Title Pt will demonstrate ability to report 3 ways to decrease pain in work day to indicate self strategies learned from therapy sessions.  (Target Date: 04/01/15)   Baseline Achieved 11/13: pain mitigated at work by activating core musculature during mobility, using tennis ball for self-MFR at L piriformis, and stetching L piriformis.   Time 4   Period Weeks   Status Achieved   PT SHORT TERM GOAL #3   Title Pt will increase hip flex strength to 4+/5 to indicate improvement in functional mobility.  (Target Date: 04/01/15)   Baseline Achieved 11/23.   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 (Target Date: 04/01/15)   Baseline Met 11/23.   Status Achieved           PT Long Term Goals - 04/21/15 0806    PT LONG TERM GOAL #1   Title Pt will be independent with HEP for core strengthening and stretching to reduce pain in low back.  (Target Date: 04/29/15)   Baseline met 04/21/15   Time 8   Period Weeks   Status Achieved   PT LONG TERM GOAL #2   Title Pt will report less than or equal to 5/10 pain following work day in order to demonstrate improved functional mobility. (Target Date: 04/29/15)   Baseline met 04/21/15 (on average pt reports 5/10 pain)   Time 8   Period Weeks   Status Achieved   PT LONG TERM GOAL  #3   Title Pt will improve Modified Oswestry pain Scale score to greater than or equal to 15.2 to demonsrtate decreased in self reported pain during functional mobility. (Target Date: 04/29/15)   Baseline New score of 22% demonstrating improvement, but not to goal level. Previous score was 28.0% (04/21/15)   Time 8   Period Weeks   Status Partially Met  PT LONG TERM GOAL #4   Title Pt will independently demonstrate improved body mechanics and ergonomics with work station in order to decrease risk of injury and increased pain. (Target Date: 04/29/15)   Baseline met 04/21/15   Time 8   Period Weeks   PT LONG TERM GOAL #5   Title Pt will verbalize transition to community wellness program to indicate continued strengthening and endurance to decrease pain with all functional mobility.  (Target Date: 04/29/15)   Baseline met 04/21/15 (going to Zumba)   Time 8   Period Weeks   Status Achieved               Plan - 04/21/15 0806    Clinical Impression Statement Skilled session focused on checking LTG's as pt and PT feel that she is now at a point to continue her fitness goals from community level.  She as met 4/5 LTG"s and partially met 5th goal for modified oswestry scale as she did show functional improvement in back pain related to function, but did not meet goal.  Note that her back pain is much improved and she no longer has L sided sciatica pain and pain is more centralized to low back when she does have pain.  She is also not demonstrating any further foot drop on L side, demonstrating improved mobility and safety.     Pt will benefit from skilled therapeutic intervention in order to improve on the following deficits Decreased activity tolerance;Decreased balance;Decreased endurance;Decreased mobility;Decreased strength;Difficulty walking;Impaired perceived functional ability;Impaired flexibility;Impaired sensation;Postural dysfunction;Pain   Rehab Potential Good   PT Frequency 1x / week    PT Duration 8 weeks   PT Treatment/Interventions ADLs/Self Care Home Management;Electrical Stimulation;Moist Heat;Traction;Ultrasound;Gait training;Stair training;Functional mobility training;Therapeutic activities;Therapeutic exercise;Neuromuscular re-education;Patient/family education;Manual techniques   PT Next Visit Plan Teach pt to contract, isolate lumbar multifidus in prone then progress to sidelying > seated.   Consulted and Agree with Plan of Care Patient       PHYSICAL THERAPY DISCHARGE SUMMARY  Visits from Start of Care: 6  Current functional level related to goals / functional outcomes: See LTG's above   Remaining deficits: See goals above.  Pt making great gains in functional mobility with decreased pain.  She does continue to have some degree of pain follow work days, however has been able to use stretching and exercises to decrease pain throughout the day.     Education / Equipment: HEP  Plan: Patient agrees to discharge.  Patient goals were met. Patient is being discharged due to meeting the stated rehab goals.  ?????        Problem List Patient Active Problem List   Diagnosis Date Noted  . History of cervical dysplasia 04/08/2015  . Abdominal bloating associated with menstruation 04/08/2015  . Chronic low back pain 01/27/2015  . Unspecified asthma(493.90) 02/02/2014  . GERD (gastroesophageal reflux disease) 02/02/2014  . Asthma 02/05/2013    Cameron Sprang, PT, MPT [facility] [address] [place], [place], [address] Phone: [phone]   Fax:  [phone] 04/21/2015, 11:58 AM  Name: GAETANA KAWAHARA MRN: [id number] Date of Birth: [birth year]

## 2015-04-22 ENCOUNTER — Ambulatory Visit: Payer: Self-pay | Admitting: Physical Therapy

## 2015-04-29 ENCOUNTER — Ambulatory Visit: Payer: Self-pay | Admitting: Physical Therapy

## 2015-05-06 ENCOUNTER — Ambulatory Visit: Payer: Self-pay | Admitting: Physical Therapy

## 2015-05-13 ENCOUNTER — Ambulatory Visit: Payer: Self-pay | Admitting: Rehabilitation

## 2015-06-02 ENCOUNTER — Ambulatory Visit (INDEPENDENT_AMBULATORY_CARE_PROVIDER_SITE_OTHER): Payer: BC Managed Care – PPO | Admitting: Neurology

## 2015-06-02 ENCOUNTER — Encounter: Payer: Self-pay | Admitting: Neurology

## 2015-06-02 VITALS — BP 116/75 | HR 105 | Ht 61.0 in | Wt 190.5 lb

## 2015-06-02 DIAGNOSIS — G8929 Other chronic pain: Secondary | ICD-10-CM

## 2015-06-02 DIAGNOSIS — M545 Low back pain: Secondary | ICD-10-CM | POA: Diagnosis not present
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
## 2015-06-02 NOTE — Patient Instructions (Signed)

## 2015-06-15 ENCOUNTER — Ambulatory Visit (INDEPENDENT_AMBULATORY_CARE_PROVIDER_SITE_OTHER): Payer: Self-pay | Admitting: Neurology

## 2015-06-15 ENCOUNTER — Other Ambulatory Visit: Payer: Self-pay | Admitting: Neurology

## 2015-06-15 ENCOUNTER — Ambulatory Visit (INDEPENDENT_AMBULATORY_CARE_PROVIDER_SITE_OTHER): Payer: BC Managed Care – PPO | Admitting: Neurology

## 2015-06-15 ENCOUNTER — Encounter: Payer: Self-pay | Admitting: Neurology

## 2015-06-15 DIAGNOSIS — G8929 Other chronic pain: Secondary | ICD-10-CM

## 2015-06-15 DIAGNOSIS — M545 Low back pain, unspecified: Secondary | ICD-10-CM

## 2015-06-15 NOTE — Procedures (Signed)
     HISTORY:  Heather Mata is a 40 year old patient with a history of numbness in the right arm, some chronic low back pain and left lower extremity discomfort. The patient is being evaluated for these issues. MRI evaluations of the cervical spine and lumbar spine do not show an etiology of her symptoms.  NERVE CONDUCTION STUDIES:  Nerve conduction studies were performed on right upper extremity. The distal motor latencies and motor amplitudes for the median and ulnar nerves were within normal limits. The F wave latencies and nerve conduction velocities for these nerves were also normal. The sensory latencies for the median and ulnar nerves were normal.  Nerve conduction studies were performed on both lower extremities. The distal motor latencies and motor amplitudes for the peroneal and posterior tibial nerves were within normal limits. The nerve conduction velocities for these nerves were also normal. The H reflex latencies were normal. The sensory latencies for the peroneal nerves were within normal limits.   EMG STUDIES:  EMG study was performed on the left lower extremity:  The tibialis anterior muscle reveals 2 to 4K motor units with full recruitment. No fibrillations or positive waves were seen. The peroneus tertius muscle reveals 2 to 4K motor units with full recruitment. No fibrillations or positive waves were seen. The medial gastrocnemius muscle reveals 1 to 3K motor units with full recruitment. No fibrillations or positive waves were seen. The vastus lateralis muscle reveals 2 to 4K motor units with full recruitment. No fibrillations or positive waves were seen. The iliopsoas muscle reveals 2 to 4K motor units with full recruitment. No fibrillations or positive waves were seen. The biceps femoris muscle (long head) reveals 2 to 4K motor units with full recruitment. No fibrillations or positive waves were seen. The lumbosacral paraspinal muscles were tested at 3 levels, and  revealed no abnormalities of insertional activity at all 3 levels tested. There was good relaxation.   IMPRESSION:  Nerve conduction studies done on the right upper extremity and both lower extremities reveal no abnormalities, there is no evidence of a neuropathy. EMG evaluation of the left lower extremity is unremarkable, without evidence of an overlying lumbosacral radiculopathy.  Jill Alexanders MD 06/15/2015 9:51 AM  [facility] [address] [place], [place] [address]  Phone [phone] Fax [phone]

## 2015-06-15 NOTE — Progress Notes (Signed)
Please refer to EMG and nerve conduction study procedure note. 

## 2015-06-15 NOTE — Progress Notes (Signed)
Heather Mata is a 40 year old patient with a history of chronic low back pain and left lower extremity discomfort. She also reports some numbness and tingling sensations in her right arm and hand. She comes in today for EMG and nerve conduction studies. Nerve conduction studies on of legs, right arm are normal. EMG of the left leg is normal.  The patient will be set up for a left SI joint injection. She will follow-up in 3-4 months.

## 2015-06-23 ENCOUNTER — Ambulatory Visit
Admission: RE | Admit: 2015-06-23 | Discharge: 2015-06-23 | Disposition: A | Discharge status: Home / Self Care | Payer: BC Managed Care – PPO | Source: Ambulatory Visit | Attending: Neurology | Admitting: Neurology

## 2015-06-23 DIAGNOSIS — G8929 Other chronic pain: Secondary | ICD-10-CM

## 2015-06-23 DIAGNOSIS — M545 Low back pain: Principal | ICD-10-CM

## 2015-06-23 MED ORDER — METHYLPREDNISOLONE ACETATE 40 MG/ML INJ SUSP (RADIOLOG
120.0000 mg | Freq: Once | INTRAMUSCULAR | Status: AC
  Administered 2015-06-23: 120 mg via INTRA_ARTICULAR

## 2015-06-23 MED ORDER — IOHEXOL 180 MG/ML  SOLN
1.0000 mL | Freq: Once | INTRAMUSCULAR | Status: AC | PRN
  Administered 2015-06-23: 1 mL via INTRA_ARTICULAR

## 2015-06-23 NOTE — Discharge Instructions (Signed)

## 2015-08-01 ENCOUNTER — Telehealth: Payer: Self-pay | Admitting: Neurology

## 2015-08-01 DIAGNOSIS — M533 Sacrococcygeal disorders, not elsewhere classified: Secondary | ICD-10-CM

## 2015-08-01 NOTE — Telephone Encounter (Signed)
Pt called said the injection in the SI joint is starting to wear off. Sts she's having pain for about 1 week. Pt is inquiring if she could have another injection April 13. She is requesting Dr Jobe Igo only as he has given her 3 injections with about 70% relief. Pt is aware Dr Jannifer Franklin is out of the office until Wednesday and this can wait until he returns.

## 2015-08-03 NOTE — Telephone Encounter (Signed)
I called the patient. The SI joint injection did help, we will repeat the injection, Heather Mata requests injection to be done on April 13, and that Dr. Jobe Igo do the injection.

## 2015-08-04 ENCOUNTER — Other Ambulatory Visit: Payer: Self-pay | Admitting: Neurology

## 2015-08-04 DIAGNOSIS — M79605 Pain in left leg: Secondary | ICD-10-CM

## 2015-08-13 ENCOUNTER — Ambulatory Visit (HOSPITAL_COMMUNITY)
Admission: RE | Admit: 2015-08-13 | Discharge: 2015-08-13 | Disposition: A | Discharge status: Home / Self Care | Payer: BC Managed Care – PPO | Source: Ambulatory Visit | Attending: Pulmonary Disease | Admitting: Pulmonary Disease

## 2015-08-13 ENCOUNTER — Other Ambulatory Visit (HOSPITAL_COMMUNITY): Payer: Self-pay | Admitting: Pulmonary Disease

## 2015-08-13 DIAGNOSIS — R059 Cough, unspecified: Secondary | ICD-10-CM

## 2015-08-13 DIAGNOSIS — R05 Cough: Secondary | ICD-10-CM | POA: Insufficient documentation

## 2015-08-23 ENCOUNTER — Other Ambulatory Visit: Payer: Self-pay | Admitting: Obstetrics & Gynecology

## 2015-08-23 MED ORDER — ALPRAZOLAM 1 MG PO TABS: 1.0000 mg | ORAL_TABLET | Freq: Three times a day (TID) | ORAL | Status: DC

## 2015-09-01 ENCOUNTER — Ambulatory Visit
Admission: RE | Admit: 2015-09-01 | Discharge: 2015-09-01 | Disposition: A | Discharge status: Home / Self Care | Payer: BC Managed Care – PPO | Source: Ambulatory Visit | Attending: Neurology | Admitting: Neurology

## 2015-09-01 ENCOUNTER — Other Ambulatory Visit: Payer: Self-pay

## 2015-09-01 DIAGNOSIS — M79605 Pain in left leg: Secondary | ICD-10-CM

## 2015-09-01 MED ORDER — IOHEXOL 180 MG/ML  SOLN
1.0000 mL | Freq: Once | INTRAMUSCULAR | Status: AC | PRN
  Administered 2015-09-01: 1 mL via INTRA_ARTICULAR

## 2015-09-01 MED ORDER — METHYLPREDNISOLONE ACETATE 40 MG/ML INJ SUSP (RADIOLOG
120.0000 mg | Freq: Once | INTRAMUSCULAR | Status: AC
  Administered 2015-09-01: 120 mg via INTRA_ARTICULAR

## 2015-10-19 ENCOUNTER — Encounter: Payer: Self-pay | Admitting: Neurology

## 2015-10-19 ENCOUNTER — Ambulatory Visit (INDEPENDENT_AMBULATORY_CARE_PROVIDER_SITE_OTHER): Payer: BC Managed Care – PPO | Admitting: Neurology

## 2015-10-19 VITALS — BP 114/76 | HR 76 | Ht 61.0 in | Wt 191.0 lb

## 2015-10-19 DIAGNOSIS — M533 Sacrococcygeal disorders, not elsewhere classified: Secondary | ICD-10-CM | POA: Insufficient documentation

## 2015-10-19 DIAGNOSIS — M545 Low back pain, unspecified: Secondary | ICD-10-CM

## 2015-10-19 DIAGNOSIS — G8929 Other chronic pain: Secondary | ICD-10-CM

## 2015-10-19 HISTORY — DX: Sacrococcygeal disorders, not elsewhere classified: M53.3

## 2015-10-19 MED ORDER — MELOXICAM 15 MG PO TABS: 15.0000 mg | ORAL_TABLET | Freq: Every day | ORAL | Status: DC
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
## 2015-10-20 ENCOUNTER — Telehealth: Payer: Self-pay | Admitting: Neurology

## 2015-10-20 NOTE — Telephone Encounter (Signed)
Called patient left her a message to return my call.

## 2015-10-20 NOTE — Telephone Encounter (Signed)
Dr. Jannifer Franklin please advise . Dr. Ace Gins is not taking insurance at this time. Spoke to patient she needs somewhere who takes insurance. Are you ok with her seeing Dr. Hardin Negus ?

## 2015-10-20 NOTE — Telephone Encounter (Signed)
Okay to see Dr. Nicholaus Bloom.

## 2015-10-24 ENCOUNTER — Telehealth: Payer: Self-pay | Admitting: Neurology

## 2015-10-24 NOTE — Telephone Encounter (Signed)
Called and left patient a message asking her to call me back.  

## 2016-02-18 ENCOUNTER — Other Ambulatory Visit: Payer: Self-pay | Admitting: Obstetrics & Gynecology

## 2016-02-20 ENCOUNTER — Ambulatory Visit: Payer: Self-pay | Admitting: Adult Health

## 2016-02-21 ENCOUNTER — Encounter: Payer: Self-pay | Admitting: Adult Health

## 2016-03-05 ENCOUNTER — Telehealth: Payer: Self-pay | Admitting: Neurology

## 2016-03-05 NOTE — Telephone Encounter (Signed)
I called patient. Okay to continue see Dr. Hardin Negus, no reason for revisit here, may make a revisit if the patient feels that she needs to be seen.

## 2016-03-05 NOTE — Telephone Encounter (Signed)
Dr Willis- please advise 

## 2016-03-05 NOTE — Telephone Encounter (Signed)
Pt called said she is [facility] and he referred her to Dr Hardin Negus- Chiropractor. She said she is now paying $300/mth in copays and has to get off work for each appt. She said she is wanting to know if it is necessary she make appt to see Dr Viona Gilmore until Dr Hardin Negus (pain clinic) releases her?
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
## 2016-09-11 ENCOUNTER — Encounter: Payer: Self-pay | Admitting: Adult Health

## 2016-09-11 ENCOUNTER — Other Ambulatory Visit (HOSPITAL_COMMUNITY)
Admission: RE | Admit: 2016-09-11 | Discharge: 2016-09-11 | Disposition: A | Discharge status: Home / Self Care | Payer: BC Managed Care – PPO | Source: Ambulatory Visit | Attending: Adult Health | Admitting: Adult Health

## 2016-09-11 ENCOUNTER — Ambulatory Visit (INDEPENDENT_AMBULATORY_CARE_PROVIDER_SITE_OTHER): Payer: BC Managed Care – PPO | Admitting: Adult Health

## 2016-09-11 VITALS — BP 134/86 | HR 76 | Ht 61.0 in | Wt 209.5 lb

## 2016-09-11 DIAGNOSIS — R635 Abnormal weight gain: Secondary | ICD-10-CM | POA: Insufficient documentation

## 2016-09-11 DIAGNOSIS — Z01419 Encounter for gynecological examination (general) (routine) without abnormal findings: Secondary | ICD-10-CM | POA: Insufficient documentation

## 2016-09-11 DIAGNOSIS — R1031 Right lower quadrant pain: Secondary | ICD-10-CM | POA: Insufficient documentation

## 2016-09-11 DIAGNOSIS — R140 Abdominal distension (gaseous): Secondary | ICD-10-CM

## 2016-09-11 DIAGNOSIS — Z87410 Personal history of cervical dysplasia: Secondary | ICD-10-CM

## 2016-09-11 NOTE — Progress Notes (Signed)
Patient ID: Heather Mata, female   DOB: [birth year], 41 y.o.   MRN: [id number] History of Present Illness: Heather Mata is a 41 year old white female in for well woman gyn exam and pap.She complains of bloating and pain in RLQ that radiates to vagina at times, and has gained weight and can't seem to lose it.Had labs with PCP and TSH was normal. PCP is Dr Luan Pulling.    Current Medications, Allergies, Past Medical History, Past Surgical History, Family History and Social History were reviewed in [employer] record.     Review of Systems: Patient denies any headaches, hearing loss, fatigue, blurred vision, shortness of breath, chest pain, problems with bowel movements, urination, or intercourse. No joint pain or mood swings. +bloating, RLQ pain at times, radiates to vagina. +weight gain   Physical Exam:BP 134/86 (BP Location: Left Arm, Patient Position: Sitting, Cuff Size: Large)   Pulse 76   Ht 5\' 1"  (1.549 m)   Wt 209 lb 8 oz (95 kg)   LMP 09/09/2016 (Exact Date)   BMI 39.58 kg/m  General:  Well developed, well nourished, no acute distress Skin:  Warm and dry Neck:  Midline trachea, normal thyroid, good ROM, no lymphadenopathy Lungs; Clear to auscultation bilaterally Breast:  No dominant palpable mass, retraction, or nipple discharge Cardiovascular: Regular rate and rhythm Abdomen:  Soft, non tender, no hepatosplenomegaly Pelvic:  External genitalia is normal in appearance, no lesions.  The vagina is normal in appearance. Urethra has no lesions or masses. The cervix is smooth, pap with HPV and GC/CHL performed.  Uterus is felt to be normal size, shape, and contour.  No adnexal masses or tenderness noted.Bladder is non tender, no masses felt. Extremities/musculoskeletal:  No swelling or varicosities noted, no clubbing or cyanosis Psych:  No mood changes, alert and cooperative,seems happy PHQ 2 score 0.  Impression: 1. Encounter for gynecological examination with Papanicolaou  smear of cervix   2. History of cervical dysplasia   3. Bloating   4. RLQ abdominal pain   5. Weight gain       Plan: Return in 2 weeks for GYN [place] Physical in 1 year, pap in 3 if normal Labs with PCP  Mammogram at 41

## 2016-09-13 LAB — CYTOLOGY - PAP
Chlamydia: NEGATIVE
Diagnosis: NEGATIVE
HPV: NOT DETECTED
Neisseria Gonorrhea: NEGATIVE

## 2016-09-14 ENCOUNTER — Other Ambulatory Visit: Payer: Self-pay | Admitting: Obstetrics & Gynecology

## 2016-09-19 ENCOUNTER — Telehealth: Payer: Self-pay | Admitting: Adult Health

## 2016-09-19 NOTE — Telephone Encounter (Signed)
Pt called stating that she would like a call back  From [place], Pt did not state the reason why. Please contact pt

## 2016-09-19 NOTE — Telephone Encounter (Signed)
Pt aware pap negative, with negative HPV and GC/CHL

## 2016-09-27 ENCOUNTER — Ambulatory Visit (INDEPENDENT_AMBULATORY_CARE_PROVIDER_SITE_OTHER): Payer: BC Managed Care – PPO

## 2016-09-27 DIAGNOSIS — R140 Abdominal distension (gaseous): Secondary | ICD-10-CM

## 2016-09-27 DIAGNOSIS — R1031 Right lower quadrant pain: Secondary | ICD-10-CM | POA: Diagnosis not present

## 2016-09-27 NOTE — Progress Notes (Signed)
PELVIC US TA/TV: homogeneous anteverted uterus,wnl,mult.small simple nabothian cysts,normal ovaries bilat,EEC 5.8 mm,no free fluid,no pain during ultrasound,ovaries appear mobile

## 2016-09-28 ENCOUNTER — Telehealth: Payer: Self-pay | Admitting: Adult Health

## 2016-09-28 NOTE — Telephone Encounter (Signed)
Pt aware [place] was normal

## 2016-10-23 ENCOUNTER — Telehealth: Payer: Self-pay | Admitting: *Deleted

## 2016-10-23 NOTE — Telephone Encounter (Signed)
Chase with [facility] called to verify that it was ok to refill pt alprazolam 1 tid prn Rx since another prescriber prescribed her klonopin 2 tablets at bedtime prn. I verified with Dr Elonda Husky who said it was ok to refill at this time.

## 2016-11-29 ENCOUNTER — Telehealth: Payer: Self-pay | Admitting: *Deleted

## 2016-11-29 NOTE — Telephone Encounter (Signed)
Chase from [facility] called to verify ok to refill alprazolam since patient is also prescribed clonazepam. Informed patient that per the last note on 10/23/16 it was verified with Dr Elonda Husky that it was ok to refill. Stated he did not note on prescription.

## 2017-03-15 ENCOUNTER — Other Ambulatory Visit: Payer: Self-pay | Admitting: Obstetrics & Gynecology

## 2017-07-09 ENCOUNTER — Telehealth: Payer: Self-pay | Admitting: *Deleted

## 2017-07-09 MED ORDER — NORGESTIM-ETH ESTRAD TRIPHASIC 0.18/0.215/0.25 MG-35 MCG PO TABS: 1.0000 | ORAL_TABLET | Freq: Every day | ORAL | 11 refills | Status: DC

## 2017-07-09 NOTE — Telephone Encounter (Signed)
Left message to call about OCs

## 2017-07-09 NOTE — Addendum Note (Signed)
Addended by: Derrek Monaco A on: 07/09/2017 03:26 PM   Modules accepted: Orders

## 2017-07-09 NOTE — Telephone Encounter (Signed)
Pt requests getting back on OCs, wants ortho tri cyclen,she does not smoke and has taken in the past, use condoms for at least one pack

## 2017-10-09 ENCOUNTER — Other Ambulatory Visit: Payer: Self-pay | Admitting: Obstetrics & Gynecology

## 2018-01-21 ENCOUNTER — Other Ambulatory Visit: Payer: Self-pay | Admitting: Adult Health

## 2018-01-21 DIAGNOSIS — Z1231 Encounter for screening mammogram for malignant neoplasm of breast: Secondary | ICD-10-CM

## 2018-01-27 DIAGNOSIS — Z029 Encounter for administrative examinations, unspecified: Secondary | ICD-10-CM

## 2018-01-30 ENCOUNTER — Ambulatory Visit (HOSPITAL_COMMUNITY)
Admission: RE | Admit: 2018-01-30 | Discharge: 2018-01-30 | Disposition: A | Discharge status: Home / Self Care | Payer: BC Managed Care – PPO | Source: Ambulatory Visit | Attending: Adult Health | Admitting: Adult Health

## 2018-01-30 DIAGNOSIS — Z1231 Encounter for screening mammogram for malignant neoplasm of breast: Secondary | ICD-10-CM | POA: Diagnosis not present

## 2018-04-14 ENCOUNTER — Other Ambulatory Visit: Payer: Self-pay | Admitting: Adult Health

## 2018-05-14 ENCOUNTER — Other Ambulatory Visit: Payer: Self-pay | Admitting: Obstetrics & Gynecology

## 2018-05-14 MED ORDER — ALPRAZOLAM 1 MG PO TABS: 1.0000 mg | ORAL_TABLET | Freq: Three times a day (TID) | ORAL | 5 refills | Status: DC | PRN

## 2018-05-19 ENCOUNTER — Encounter: Payer: Self-pay | Admitting: Adult Health

## 2018-05-19 ENCOUNTER — Other Ambulatory Visit: Payer: Self-pay

## 2018-05-19 ENCOUNTER — Other Ambulatory Visit (HOSPITAL_COMMUNITY)
Admission: RE | Admit: 2018-05-19 | Discharge: 2018-05-19 | Disposition: A | Discharge status: Home / Self Care | Payer: BC Managed Care – PPO | Source: Ambulatory Visit | Attending: Adult Health | Admitting: Adult Health

## 2018-05-19 ENCOUNTER — Ambulatory Visit: Payer: BC Managed Care – PPO | Admitting: Adult Health

## 2018-05-19 VITALS — BP 120/81 | HR 118 | Ht 61.0 in | Wt 208.0 lb

## 2018-05-19 DIAGNOSIS — Z1211 Encounter for screening for malignant neoplasm of colon: Secondary | ICD-10-CM | POA: Diagnosis not present

## 2018-05-19 DIAGNOSIS — Z1212 Encounter for screening for malignant neoplasm of rectum: Secondary | ICD-10-CM | POA: Diagnosis not present

## 2018-05-19 DIAGNOSIS — Z01419 Encounter for gynecological examination (general) (routine) without abnormal findings: Secondary | ICD-10-CM | POA: Diagnosis present

## 2018-05-19 DIAGNOSIS — Z87410 Personal history of cervical dysplasia: Secondary | ICD-10-CM

## 2018-05-19 DIAGNOSIS — R232 Flushing: Secondary | ICD-10-CM

## 2018-05-19 LAB — HEMOCCULT GUIAC POC 1CARD (OFFICE): FECAL OCCULT BLD: NEGATIVE

## 2018-05-19 MED ORDER — NORETHIN-ETH ESTRAD-FE BIPHAS 1 MG-10 MCG / 10 MCG PO TABS: 1.0000 | ORAL_TABLET | Freq: Every day | ORAL | 0 refills | Status: DC

## 2018-05-19 NOTE — Progress Notes (Signed)
Patient ID: Heather Mata, female   DOB: [birth year], 42 y.o.   MRN: [id number] History of Present Illness: Heather Mata is a 42 year old white female in for a well woman gyn exam and pap. PCP is Dr Luan Pulling.    Current Medications, Allergies, Past Medical History, Past Surgical History, Family History and Social History were reviewed in [employer] record.     Review of Systems: Patient denies any headaches, hearing loss, fatigue, blurred vision, shortness of breath, chest pain, abdominal pain, problems with bowel movements, urination, or intercourse.(not currently active). No joint pain,she is moody and is having hot flashes, can be bad at times, she is off OCs, she had headaches.    Physical Exam:BP 120/81 (BP Location: Right Arm, Patient Position: Sitting, Cuff Size: Normal)   Pulse (!) 118   Ht 5\' 1"  (1.549 m)   Wt 208 lb (94.3 kg)   LMP 05/14/2018   BMI 39.30 kg/m  General:  Well developed, well nourished, no acute distress Skin:  Warm and dry Neck:  Midline trachea, normal thyroid, good ROM, no lymphadenopathy Lungs; Clear to auscultation bilaterally Breast:  No dominant palpable mass, retraction, or nipple discharge Cardiovascular: Regular rate and rhythm Abdomen:  Soft, non tender, no hepatosplenomegaly Pelvic:  External genitalia is normal in appearance, no lesions.  The vagina is normal in appearance. Urethra has no lesions or masses. The cervix is bulbous.pap with HPV performed.  Uterus is felt to be normal size, shape, and contour.  No adnexal masses or tenderness noted.Bladder is non tender, no masses felt. Rectal: Good sphincter tone, no polyps, or hemorrhoids felt.  Hemoccult negative. Extremities/musculoskeletal:  No swelling or varicosities noted, no clubbing or cyanosis Psych:  No mood changes, alert and cooperative,seems happy PHQ 9 score is 5, is on Cymbalta, denies being suicidal but is moody. Fall risk is low. Examination chaperoned by Shela Nevin  RN.   Impression: 1. Encounter for gynecological examination with Papanicolaou smear of cervix   2. History of cervical dysplasia   3. Screening for colorectal cancer   4. Hot flashes       Plan: Given 3 sample packs of lo loestrin to try, start with next period F/U in 3 months Physical in 1 year Pap in 3 if normal Labs with PCP Mammogram yearly

## 2018-05-23 LAB — CYTOLOGY - PAP
Adequacy: ABSENT
Chlamydia: NEGATIVE
Diagnosis: NEGATIVE
HPV (WINDOPATH): NOT DETECTED
NEISSERIA GONORRHEA: NEGATIVE

## 2018-09-01 ENCOUNTER — Telehealth: Payer: Self-pay | Admitting: Adult Health

## 2018-09-01 ENCOUNTER — Telehealth: Payer: Self-pay | Admitting: *Deleted

## 2018-09-01 MED ORDER — METRONIDAZOLE 500 MG PO TABS: 500.0000 mg | ORAL_TABLET | Freq: Two times a day (BID) | ORAL | 0 refills | Status: DC

## 2018-09-01 NOTE — Telephone Encounter (Signed)
Patient called stating that she would like a call back from [place]. Patient did not state the reason why. Please contact pt

## 2018-09-01 NOTE — Telephone Encounter (Signed)
Has odor when pees, has thick discharge mucous discharge, has been seeing somebody, no itching or burning. Will rx flagyl

## 2018-09-01 NOTE — Telephone Encounter (Signed)
Patient states she has a vaginal odor along with thick discharge.  Please advise.

## 2018-09-01 NOTE — Addendum Note (Signed)
Addended by: Derrek Monaco A on: 09/01/2018 01:48 PM   Modules accepted: Orders

## 2018-09-15 ENCOUNTER — Telehealth: Payer: Self-pay | Admitting: Adult Health

## 2018-09-15 MED ORDER — FLUCONAZOLE 150 MG PO TABS: ORAL_TABLET | ORAL | 1 refills | Status: DC

## 2018-09-15 MED ORDER — METRONIDAZOLE 0.75 % VA GEL: 1.0000 | Freq: Every day | VAGINAL | 1 refills | Status: DC

## 2018-09-15 NOTE — Telephone Encounter (Signed)
Heather Mata notices odor after sex, will Rx Metrogel and also diflucan as she is on antibiotic from PCP for lung infection

## 2018-09-15 NOTE — Telephone Encounter (Signed)
Left message that I returned her call  

## 2018-09-15 NOTE — Telephone Encounter (Signed)
Fumiko would like for Anderson Malta to give her a call in ref to what they spoke about

## 2018-09-15 NOTE — Addendum Note (Signed)
Addended by: Derrek Monaco A on: 09/15/2018 02:07 PM   Modules accepted: Orders

## 2018-11-18 ENCOUNTER — Other Ambulatory Visit: Payer: Self-pay

## 2018-11-18 DIAGNOSIS — Z20822 Contact with and (suspected) exposure to covid-19: Secondary | ICD-10-CM

## 2018-11-24 LAB — NOVEL CORONAVIRUS, NAA: SARS-CoV-2, NAA: NOT DETECTED

## 2018-12-01 ENCOUNTER — Other Ambulatory Visit: Payer: Self-pay | Admitting: Obstetrics & Gynecology

## 2018-12-17 ENCOUNTER — Other Ambulatory Visit: Payer: Self-pay

## 2018-12-17 ENCOUNTER — Other Ambulatory Visit (HOSPITAL_COMMUNITY): Payer: Self-pay | Admitting: Pulmonary Disease

## 2018-12-17 ENCOUNTER — Ambulatory Visit (HOSPITAL_COMMUNITY)
Admission: RE | Admit: 2018-12-17 | Discharge: 2018-12-17 | Disposition: A | Discharge status: Home / Self Care | Payer: BC Managed Care – PPO | Source: Ambulatory Visit | Attending: Pulmonary Disease | Admitting: Pulmonary Disease

## 2018-12-17 ENCOUNTER — Other Ambulatory Visit (HOSPITAL_COMMUNITY)
Admission: RE | Admit: 2018-12-17 | Discharge: 2018-12-17 | Disposition: A | Discharge status: Home / Self Care | Payer: BC Managed Care – PPO | Source: Ambulatory Visit | Attending: Pulmonary Disease | Admitting: Pulmonary Disease

## 2018-12-17 DIAGNOSIS — J4540 Moderate persistent asthma, uncomplicated: Secondary | ICD-10-CM | POA: Insufficient documentation

## 2018-12-17 LAB — CBC WITH DIFFERENTIAL/PLATELET
Basophils Absolute: 0.1 10*3/uL (ref 0.0–0.1)
Basophils Relative: 1 %
Eosinophils Absolute: 0.2 10*3/uL (ref 0.0–0.5)
Eosinophils Relative: 2 %
HCT: 37.3 % (ref 36.0–46.0)
Hemoglobin: 11.5 g/dL — ABNORMAL LOW (ref 12.0–15.0)
Lymphocytes Relative: 24 %
Lymphs Abs: 2.3 10*3/uL (ref 0.7–4.0)
MCH: 25.9 pg — ABNORMAL LOW (ref 26.0–34.0)
MCHC: 30.8 g/dL (ref 30.0–36.0)
MCV: 84 fL (ref 80.0–100.0)
Monocytes Absolute: 0.7 10*3/uL (ref 0.1–1.0)
Monocytes Relative: 7 %
Neutro Abs: 6.5 10*3/uL (ref 1.7–7.7)
Neutrophils Relative %: 66 %
Platelets: 333 10*3/uL (ref 150–400)
RBC: 4.44 MIL/uL (ref 3.87–5.11)
RDW: 17 % — ABNORMAL HIGH (ref 11.5–15.5)
WBC: 9.9 10*3/uL (ref 4.0–10.5)
nRBC: 0 % (ref 0.0–0.2)

## 2018-12-19 LAB — IGE: IgE (Immunoglobulin E), Serum: 11 IU/mL (ref 6–495)

## 2019-02-17 ENCOUNTER — Other Ambulatory Visit: Payer: Self-pay | Admitting: Obstetrics & Gynecology

## 2019-02-17 MED ORDER — ALPRAZOLAM 1 MG PO TABS: 1.0000 mg | ORAL_TABLET | Freq: Three times a day (TID) | ORAL | 5 refills | Status: DC | PRN

## 2019-02-25 ENCOUNTER — Other Ambulatory Visit: Payer: Self-pay

## 2019-02-25 DIAGNOSIS — Z20822 Contact with and (suspected) exposure to covid-19: Secondary | ICD-10-CM

## 2019-02-27 LAB — NOVEL CORONAVIRUS, NAA: SARS-CoV-2, NAA: NOT DETECTED

## 2019-03-02 ENCOUNTER — Other Ambulatory Visit (HOSPITAL_COMMUNITY): Payer: Self-pay | Admitting: Adult Health

## 2019-03-02 DIAGNOSIS — Z1231 Encounter for screening mammogram for malignant neoplasm of breast: Secondary | ICD-10-CM

## 2019-03-05 ENCOUNTER — Ambulatory Visit (HOSPITAL_COMMUNITY): Payer: BC Managed Care – PPO

## 2019-03-12 ENCOUNTER — Other Ambulatory Visit: Payer: Self-pay | Admitting: Internal Medicine

## 2019-03-12 ENCOUNTER — Other Ambulatory Visit: Payer: Self-pay

## 2019-03-12 DIAGNOSIS — Z20822 Contact with and (suspected) exposure to covid-19: Secondary | ICD-10-CM

## 2019-03-14 LAB — NOVEL CORONAVIRUS, NAA: SARS-CoV-2, NAA: NOT DETECTED

## 2019-03-16 ENCOUNTER — Ambulatory Visit (HOSPITAL_COMMUNITY)
Admission: RE | Admit: 2019-03-16 | Discharge: 2019-03-16 | Disposition: A | Discharge status: Home / Self Care | Payer: BC Managed Care – PPO | Source: Ambulatory Visit | Attending: Adult Health | Admitting: Adult Health

## 2019-03-16 ENCOUNTER — Other Ambulatory Visit: Payer: Self-pay

## 2019-03-16 DIAGNOSIS — Z1231 Encounter for screening mammogram for malignant neoplasm of breast: Secondary | ICD-10-CM | POA: Insufficient documentation

## 2019-03-17 LAB — NOVEL CORONAVIRUS, NAA: SARS-CoV-2, NAA: NOT DETECTED

## 2019-05-19 ENCOUNTER — Ambulatory Visit: Payer: BC Managed Care – PPO | Attending: Internal Medicine

## 2019-05-19 ENCOUNTER — Other Ambulatory Visit: Payer: Self-pay

## 2019-05-19 DIAGNOSIS — Z20822 Contact with and (suspected) exposure to covid-19: Secondary | ICD-10-CM

## 2019-05-20 LAB — NOVEL CORONAVIRUS, NAA: SARS-CoV-2, NAA: NOT DETECTED

## 2019-05-25 ENCOUNTER — Other Ambulatory Visit: Payer: BC Managed Care – PPO | Admitting: Adult Health

## 2019-06-09 ENCOUNTER — Institutional Professional Consult (permissible substitution): Payer: BC Managed Care – PPO | Admitting: Internal Medicine

## 2019-06-17 ENCOUNTER — Other Ambulatory Visit: Payer: Self-pay

## 2019-06-17 ENCOUNTER — Ambulatory Visit: Payer: BC Managed Care – PPO | Attending: Internal Medicine

## 2019-06-17 DIAGNOSIS — Z20822 Contact with and (suspected) exposure to covid-19: Secondary | ICD-10-CM

## 2019-06-18 LAB — NOVEL CORONAVIRUS, NAA: SARS-CoV-2, NAA: NOT DETECTED

## 2019-07-06 ENCOUNTER — Institutional Professional Consult (permissible substitution): Payer: BC Managed Care – PPO | Admitting: Internal Medicine

## 2019-07-08 ENCOUNTER — Encounter: Payer: Self-pay | Admitting: Internal Medicine

## 2019-07-08 ENCOUNTER — Ambulatory Visit (INDEPENDENT_AMBULATORY_CARE_PROVIDER_SITE_OTHER): Payer: BC Managed Care – PPO

## 2019-07-08 ENCOUNTER — Ambulatory Visit: Payer: BC Managed Care – PPO | Admitting: Internal Medicine

## 2019-07-08 ENCOUNTER — Other Ambulatory Visit: Payer: Self-pay

## 2019-07-08 DIAGNOSIS — R0600 Dyspnea, unspecified: Secondary | ICD-10-CM

## 2019-07-08 DIAGNOSIS — R0609 Other forms of dyspnea: Secondary | ICD-10-CM | POA: Insufficient documentation

## 2019-07-08 DIAGNOSIS — R05 Cough: Secondary | ICD-10-CM | POA: Diagnosis not present

## 2019-07-08 DIAGNOSIS — R058 Other specified cough: Secondary | ICD-10-CM | POA: Insufficient documentation

## 2019-07-08 MED ORDER — PREDNISONE 10 MG PO TABS: ORAL_TABLET | ORAL | 0 refills | Status: DC

## 2019-07-08 MED ORDER — STIOLTO RESPIMAT 2.5-2.5 MCG/ACT IN AERS: 2.0000 | INHALATION_SPRAY | Freq: Every day | RESPIRATORY_TRACT | 0 refills | Status: DC

## 2019-07-08 MED ORDER — MISC. DEVICES MISC: 0 refills | Status: AC

## 2019-07-08 NOTE — Assessment & Plan Note (Addendum)
Refractory symptoms since winter 2020 with marked upper airway features  - Dx of VCD by [facility] around 2015 per pt - 07/08/2019  After extensive coaching inhaler device,  effectiveness =    90% with smi > try stiolto and low dose prednisone / gerd rx x 2 weeks then ov with all meds in hand  DDX of  difficult airways management almost all start with A and  include Adherence, Ace Inhibitors, Acid Reflux, Active Sinus Disease, Alpha 1 Antitripsin deficiency, Anxiety masquerading as Airways dz,  ABPA,  Allergy(esp in young), Aspiration (esp in elderly), Adverse effects of meds,  Active smoking or vaping, A bunch of PE's (a small clot burden can't cause this syndrome unless there is already severe underlying pulm or vascular dz with poor reserve) plus two Bs  = Bronchiectasis and Beta blocker use..and one C= CHF   Adherence is always the initial "prime suspect" and is a multilayered concern that requires a "trust but verify" approach in every patient - starting with knowing how to use medications, especially inhalers, correctly, keeping up with refills and understanding the fundamental difference between maintenance and prns vs those medications only taken for a very short course and then stopped and not refilled.  - see smi teaching above - return with all meds in hand using a trust but verify approach to confirm accurate Medication  Reconciliation The principal here is that until we are certain that the  patients are doing what we've asked, it makes no sense to ask them to do more.    ? Acid (or non-acid) GERD > always difficult to exclude as up to 75% of pts in some series report no assoc GI/ Heartburn symptoms> rec continue max (24h)  acid suppression and diet restrictions/ reviewed    ? Allergy/asthma > prednisone 20 mg daily until better then 10 mg daily x 5 days and stop.  Advised your breathing worsens or you need to use your rescue inhaler more than twice weekly or wake up more than twice a month with  any respiratory symptoms or require more than two rescue inhalers per year, we need to see you right away because this means we're not controlling the underlying problem (inflammation) adequately. Rescue inhalers (albuterol) do not control inflammation and overuse can lead to unnecessary and costly consequences.  They can make you feel better temporarily but eventually they will quit working effectively much as sleep aids lead to more insomnia if used regularly.    ? ABPA > note eos 0.2 and IgE very low 12/17/2018 rule this out   ? Adverse drug reaction esp dpi's esp breo high dose > try off and replace short term with smi  ? Anxiety/depression/ vcd > usually at the bottom of this list of usual suspects but should be  on this pt's based on H and P and note already on psychotropics and may interfere with adherence and also interpretation of response or lack thereof to symptom management which can be quite subjective.

## 2019-07-08 NOTE — Patient Instructions (Addendum)
Plan A = Automatic = Always=   Stiolto 2 puffs each am   Prednisone 10 mg  2  Daily then one daily x 5 days and stop   Plan B = Backup (to supplement plan A, not to replace it) Only use your albuterol inhaler as a rescue medication to be used if you can't catch your breath by resting or doing a relaxed purse lip breathing pattern.  - The less you use it, the better it will work when you need it. - Ok to use the inhaler up to 2 puffs  every 4 hours if you must but call for appointment if use goes up over your usual need - Don't leave home without it !!  (think of it like the spare tire for your car)   Plan C = Crisis (instead of Plan B but only if Plan B stops working) - only use your albuterol nebulizer if you first try Plan B and it fails to help > ok to use the nebulizer up to every 4 hours but if start needing it regularly call for immediate appointment   Prilosec (20 x 2) 40 mg Take 30- 60 min before your first and last meals of the day   GERD (REFLUX)  is an extremely common cause of respiratory symptoms just like yours , many times with no obvious heartburn at all.    It can be treated with medication, but also with lifestyle changes including elevation of the head of your bed (ideally with 6 -8inch blocks under the headboard of your bed),  Smoking cessation, avoidance of late meals, excessive alcohol, and avoid fatty foods, chocolate, peppermint, colas, red wine, and acidic juices such as orange juice.  NO MINT OR MENTHOL PRODUCTS SO NO COUGH DROPS  USE SUGARLESS CANDY INSTEAD (Jolley ranchers or Stover's or Life Savers) or even ice chips will also do - the key is to swallow to prevent all throat clearing. NO OIL BASED VITAMINS - use powdered substitutes.  Avoid fish oil when coughing.  Please remember to go to the  x-ray department  for your tests - we will call you with the results when they are available    Please schedule a follow up office visit in 2 weeks, call sooner if needed  with all medications /inhalers/ solutions in hand so we can verify exactly what you are taking. This includes all medications from all doctors and over the [place] separate them into two bags:  the ones you take automatically, no matter what, vs the ones you take just when you feel you need them "BAG #2 is UP TO YOU"  - this will really help [place] help you take your medications more effectively.

## 2019-07-08 NOTE — Progress Notes (Signed)
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 Pulmonary Consult    Former pt of Dr Sinda Du. Pt states has had Asthma all of her life. She c/o increased SOB, wheezing and cough worsening for the past year. She is using her albuterol inhaler 6-8 x per day and neb 8 x per day.   Dyspnea:  Steps ok p neb  Cough: harsh min mucoid with hoarseness corresponding to flare  Sleep: sev times per night needs neb /choaking/ urinary incont SABA use: way too much as above and anxious her saba hfa no longer working so over using neb now  No obvious day to day or daytime variability or assoc excess/ purulent sputum or mucus plugs or hemoptysis or cp or   or overt sinus or hb symptoms on prilosec 40 mg qd     Also denies any obvious fluctuation of symptoms with weather or environmental changes or other aggravating or alleviating factors except as outlined above   No unusual exposure hx or h/o childhood pna or knowledge of premature birth.  Current Allergies, Complete Past Medical History, Past Surgical History, Family History, and Social History were reviewed in [facility] record.  ROS  The following are not active complaints unless bolded Hoarseness, sore throat, dysphagia, dental problems, itching, sneezing,  nasal congestion or  discharge of excess mucus or purulent secretions, ear ache,   fever, chills, sweats, unintended wt loss or wt gain, classically pleuritic or exertional cp,  orthopnea pnd or arm/hand swelling  or leg swelling, presyncope, palpitations, abdominal pain, anorexia, nausea, vomiting, diarrhea  or change in bowel habits or change in bladder habits, change in stools or change in urine, dysuria, hematuria,  rash, arthralgias, visual complaints, headache, numbness, weakness or ataxia or problems with walking or coordination,  change in mood or  memory.           Past Medical History:  Diagnosis Date  . Abdominal bloating associated with menstruation 04/08/2015  . Anxiety   . Asthma   . Cancer (HCC)    cervical dysplasia  . Cervical dysplasia   . Chronic low back pain 01/27/2015  . GERD (gastroesophageal reflux disease)   . History of cervical dysplasia 04/08/2015   Had LEEP 2 x  . Hypertension   . Reflux    laryngotracheal   . 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 Vaginal Pap smear, abnormal     Outpatient Medications Prior to Visit  Medication Sig Dispense Refill  . albuterol (VENTOLIN HFA) 108 (90 Base) MCG/ACT inhaler Inhale 2 puffs into the lungs every 4 (four) hours as needed.    . ALPRAZolam (XANAX) 1 MG tablet Take 1 tablet (1 mg total) by mouth 3 (three) times daily as needed. 90 tablet  5  . benzonatate (TESSALON) 100 MG capsule Take by mouth 3 (three) times daily as needed for cough.    Marland Kitchen BREO ELLIPTA 200-25 MCG/INH AEPB Inhale 1 puff into the lungs daily.     . clonazePAM (KLONOPIN) 1 MG tablet Take 1 mg by mouth at bedtime.    . cyclobenzaprine (FLEXERIL) 10 MG tablet Take 10 mg by mouth as needed for muscle spasms.     . DULoxetine (CYMBALTA) 20 MG capsule Take 40 mg by mouth daily.    Marland Kitchen ipratropium-albuterol (DUONEB) 0.5-2.5 (3) MG/3ML SOLN Take 3 mLs by nebulization every 6 (six) hours as needed (wheezing/shortness of breath).     Marland Kitchen omeprazole (PRILOSEC) 40 MG  capsule Take 40 mg by mouth daily.     . promethazine (PHENERGAN) 25 MG tablet Take 25 mg by mouth every 6 (six) hours as needed for nausea or vomiting.    .       .       .      .       .      .       .    0  . metroNIDAZOLE (METROGEL) 0.75 % vaginal gel Place 1 Applicatorful vaginally at bedtime. 70 g 1  .           Objective:     BP 114/70 (BP Location: Left Arm, Cuff Size: Normal)   Pulse 100   Temp (!) 97.1 F (36.2 C) (Temporal)   Ht 5\' 1"  (1.549 m)   Wt 225 lb (102.1 kg)   SpO2 97% Comment: on RA  BMI 42.51 kg/m   SpO2: 97 %(on RA)    obese wf nad / harsh dry cough/ hoarseness/ mild pseuodwheeze    HEENT : pt wearing mask not removed for exam due to covid -19 concerns.    NECK :  without JVD/Nodes/TM/ nl carotid upstrokes bilaterally   LUNGS: no acc muscle use,  Nl contour chest which is clear to A and P bilaterally without cough on insp or exp maneuvers   CV:  RRR  no s3 or murmur or increase in P2, and no edema   ABD:  soft and nontender with nl inspiratory excursion in the supine position. No bruits or organomegaly appreciated, bowel sounds nl  MS:  Nl gait/ ext warm without deformities, calf tenderness, cyanosis or clubbing No obvious joint restrictions   SKIN: warm and dry without lesions    NEURO:  alert, approp, nl sensorium with  no motor or cerebellar deficits apparent.    CXR PA and Lateral:   07/08/2019 :    I personally reviewed images and   impression as follows:   Wnl/ no hyperinflation       Assessment   DOE (dyspnea on exertion) Refractory symptoms since winter 2020 with marked upper airway features  - Dx of VCD by Teoh around 2015 per pt - 07/08/2019  After extensive coaching inhaler device,  effectiveness =    90% with smi > try stiolto and low dose prednisone / gerd rx x 2 weeks then ov with all meds in hand  DDX of  difficult airways management almost all start with A and  include Adherence, Ace Inhibitors, Acid Reflux, Active  Sinus Disease, Alpha 1 Antitripsin deficiency, Anxiety masquerading as Airways dz,  ABPA,  Allergy(esp in young), Aspiration (esp in elderly), Adverse effects of meds,  Active smoking or vaping, A bunch of PE's (a small clot burden can't  cause this syndrome unless there is already severe underlying pulm or vascular dz with poor reserve) plus two Bs  = Bronchiectasis and Beta blocker use..and one C= CHF   Adherence is always the initial "prime suspect" and is a multilayered concern that requires a "trust but verify" approach in every patient - starting with knowing how to use medications, especially inhalers, correctly, keeping up with refills and understanding the fundamental difference between maintenance and prns vs those medications only taken for a very short course and then stopped and not refilled.  - see smi teaching above - return with all meds in hand using a trust but verify approach to confirm accurate Medication  Reconciliation The principal here is that until we are certain that the  patients are doing what we've asked, it makes no sense to ask them to do more.    ? Acid (or non-acid) GERD > always difficult to exclude as up to 75% of pts in some series report no assoc GI/ Heartburn symptoms> rec continue max (24h)  acid suppression and diet restrictions/ reviewed    ? Allergy/asthma > prednisone 20 mg daily until better then 10 mg daily x 5 days and stop.  Advised your breathing worsens or you need to use your rescue inhaler more than twice weekly or wake up more than twice a month with any respiratory symptoms or require more than two rescue inhalers per year, we need to see you right away because this means we're not controlling the underlying problem (inflammation) adequately. Rescue inhalers (albuterol) do not control inflammation and overuse can lead to unnecessary and costly consequences.  They can make you feel better temporarily but eventually they will quit working effectively much  as sleep aids lead to more insomnia if used regularly.    ? ABPA > note eos 0.2 and IgE very low 12/17/2018 rule this out   ? Adverse drug reaction esp dpi's esp breo high dose > try off and replace short term with smi  ? Anxiety/depression/ vcd > usually at the bottom of this list of usual suspects but should be  on this pt's based on H and P and note already on psychotropics and may interfere with adherence and also interpretation of response or lack thereof to symptom management which can be quite subjective.      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   These are the same pts (now being labeled as having "irritable larynx syndrome" by some cough centers) who not infrequently have a history of having failed to tolerate ace inhibitors,  dry powder inhalers or biphosphonates or report having atypical/extraesophageal reflux symptoms that don't respond to standard doses of PPI  and are easily confused as having aecopd or asthma flares by even experienced allergists/ pulmonologists (myself included).   Of the three most common causes of  Sub-acute / recurrent or chronic cough, only one (GERD)  can actually  contribute to/ trigger  the other two (asthma and post nasal drip syndrome)  and perpetuate the cylce of cough.  While not intuitively obvious, many patients with chronic low grade reflux do not cough until there is a primary insult that  disturbs the protective epithelial barrier and exposes sensitive nerve endings.   This is typically viral but can due to PNDS and  either may apply here.   The point is that once this occurs, it is difficult to eliminate the cycle  using anything but a maximally effective acid suppression regimen at least in the short run, accompanied by an appropriate diet to address non acid GERD and control / eliminate the cough itself for at least 3 days with tessalon and hard rock candies - no mint/ menthol- and also added short course low dose  Prednisone in case of component of Th-2 driven upper or lower airways inflammation (if cough responds short term only to relapse befor return while will on rx for uacs that would point to allergic rhinitis/ asthma or eos bronchitis)       Morbid obesity due to excess calories ([place]) Body mass index is 42.51 kg/m. No results found for: TSH  Contributing to gerd risk/ doe/reviewed the need and the process to achieve and maintain neg calorie balance > defer f/u primary care including intermittently monitoring thyroid status         Each maintenance medication was reviewed in detail including emphasizing most importantly the difference between maintenance and prns and under what circumstances the prns are to be triggered using an action plan format where appropriate.  Total time for H and P, chart review, counseling, teaching device and generating customized AVS unique to this office visit / charting = 60 min        Christinia Gully, MD 07/08/2019

## 2019-07-08 NOTE — Assessment & Plan Note (Addendum)
Dx of VCD by Heather Mata around 2015  - d/c breo 200 07/08/2019    Upper airway cough syndrome (previously labeled PNDS),  is so named because it's frequently impossible to sort out how much is  CR/sinusitis with freq throat clearing (which can be related to primary GERD)   vs  causing  secondary (" extra esophageal")  GERD from wide swings in gastric pressure that occur with throat clearing, often  promoting self use of mint and menthol lozenges that reduce the lower esophageal sphincter tone and exacerbate the problem further in a cyclical fashion.   These are the same pts (now being labeled as having "irritable larynx syndrome" by some cough centers) who not infrequently have a history of having failed to tolerate ace inhibitors,  dry powder inhalers or biphosphonates or report having atypical/extraesophageal reflux symptoms that don't respond to standard doses of PPI  and are easily confused as having aecopd or asthma flares by even experienced allergists/ pulmonologists (myself included).   Of the three most common causes of  Sub-acute / recurrent or chronic cough, only one (GERD)  can actually contribute to/ trigger  the other two (asthma and post nasal drip syndrome)  and perpetuate the cylce of cough.  While not intuitively obvious, many patients with chronic low grade reflux do not cough until there is a primary insult that disturbs the protective epithelial barrier and exposes sensitive nerve endings.   This is typically viral but can due to PNDS and  either may apply here.   The point is that once this occurs, it is difficult to eliminate the cycle  using anything but a maximally effective acid suppression regimen at least in the short run, accompanied by an appropriate diet to address non acid GERD and control / eliminate the cough itself for at least 3 days with tessalon and hard rock candies - no mint/ menthol- and also added short course low dose  Prednisone in case of component of Th-2 driven upper  or lower airways inflammation (if cough responds short term only to relapse befor return while will on rx for uacs that would point to allergic rhinitis/ asthma or eos bronchitis)

## 2019-07-08 NOTE — Assessment & Plan Note (Signed)
Body mass index is 42.51 kg/m.  -  No results found for: TSH   Contributing to gerd risk/ doe/reviewed the need and the process to achieve and maintain neg calorie balance > defer f/u primary care including intermittently monitoring thyroid status            Each maintenance medication was reviewed in detail including emphasizing most importantly the difference between maintenance and prns and under what circumstances the prns are to be triggered using an action plan format where appropriate.  Total time for H and P, chart review, counseling, teaching device and generating customized AVS unique to this office visit / charting = 60 min

## 2019-07-10 ENCOUNTER — Ambulatory Visit: Payer: BC Managed Care – PPO | Attending: Internal Medicine

## 2019-07-10 DIAGNOSIS — Z23 Encounter for immunization: Secondary | ICD-10-CM | POA: Insufficient documentation

## 2019-07-10 NOTE — Progress Notes (Signed)
   Covid-19 Vaccination Clinic  Name:  Heather Mata    MRN: [id number] DOB: 01-Jul-1975  07/10/2019  [name] was observed post Covid-19 immunization for 15 minutes without incidence. She was provided with Vaccine Information Sheet and instruction to access the V-Safe system.   [name] was instructed to call 911 with any severe reactions post vaccine: Marland Kitchen Difficulty breathing  . Swelling of your face and throat  . A fast heartbeat  . A bad rash all over your body  . Dizziness and weakness    Immunizations Administered    Name Date Dose VIS Date Route   Pfizer COVID-19 Vaccine 07/10/2019  1:22 PM 0.3 mL 05/01/2019 Intramuscular   Manufacturer: [place]   Lot: [id number]   [place]: [id number]

## 2019-07-13 NOTE — Progress Notes (Signed)
Left detailed msg ok per DPR

## 2019-07-21 ENCOUNTER — Ambulatory Visit (INDEPENDENT_AMBULATORY_CARE_PROVIDER_SITE_OTHER): Payer: BC Managed Care – PPO | Admitting: Adult Health

## 2019-07-21 ENCOUNTER — Other Ambulatory Visit (HOSPITAL_COMMUNITY)
Admission: RE | Admit: 2019-07-21 | Discharge: 2019-07-21 | Disposition: A | Discharge status: Home / Self Care | Payer: BC Managed Care – PPO | Source: Ambulatory Visit | Attending: Adult Health | Admitting: Adult Health

## 2019-07-21 ENCOUNTER — Encounter: Payer: Self-pay | Admitting: Adult Health

## 2019-07-21 ENCOUNTER — Other Ambulatory Visit: Payer: Self-pay

## 2019-07-21 VITALS — BP 142/88 | HR 106 | Ht 61.0 in | Wt 218.0 lb

## 2019-07-21 DIAGNOSIS — R232 Flushing: Secondary | ICD-10-CM

## 2019-07-21 DIAGNOSIS — Z87410 Personal history of cervical dysplasia: Secondary | ICD-10-CM

## 2019-07-21 DIAGNOSIS — Z01419 Encounter for gynecological examination (general) (routine) without abnormal findings: Secondary | ICD-10-CM | POA: Insufficient documentation

## 2019-07-21 DIAGNOSIS — Z1212 Encounter for screening for malignant neoplasm of rectum: Secondary | ICD-10-CM | POA: Diagnosis not present

## 2019-07-21 DIAGNOSIS — Z1211 Encounter for screening for malignant neoplasm of colon: Secondary | ICD-10-CM | POA: Diagnosis not present

## 2019-07-21 LAB — HEMOCCULT GUIAC POC 1CARD (OFFICE): Fecal Occult Blood, POC: NEGATIVE

## 2019-07-21 MED ORDER — NORETHIN ACE-ETH ESTRAD-FE 1-20 MG-MCG PO TABS: 1.0000 | ORAL_TABLET | Freq: Every day | ORAL | 6 refills | Status: DC

## 2019-07-21 NOTE — Progress Notes (Signed)
Patient ID: Heather Mata, female   DOB: 1975/12/23, 44 y.o.   MRN: [id number] History of Present Illness: Heather Mata is a 44 year old white female,single, [id number] in for a well woman gyn exam and pap.She sees Dr Melvyn Novas for asthma.  PCP is Dr Nevada Crane.    Current Medications, Allergies, Past Medical History, Past Surgical History, Family History and Social History were reviewed in [employer] record.     Review of Systems: Patient denies any headaches, hearing loss, fatigue, blurred vision, shortness of breath, chest pain, abdominal pain, problems with bowel movements, urination, or intercourse. No joint pain or mood swings. Periods not regular but not skipping any  +hot flashes, hair and clothes will get wet  Partner has vasectomy    Physical Exam:BP (!) 147/92 (BP Location: Right Arm, Patient Position: Sitting, Cuff Size: Large)   Pulse (!) 106   Ht 5\' 1"  (1.549 m)   Wt 218 lb (98.9 kg)   LMP 07/09/2019 (Approximate)   BMI 41.19 kg/m BP recheck 142/88. General:  Well developed, well nourished, no acute distress Skin:  Warm and dry Neck:  Midline trachea, normal thyroid, good ROM, no lymphadenopathy Lungs; Clear to auscultation bilaterally Breast:  No dominant palpable mass, retraction, or nipple discharge Cardiovascular: Regular rate and rhythm Abdomen:  Soft, non tender, no hepatosplenomegaly Pelvic:  External genitalia is normal in appearance, no lesions.  The vagina is normal in appearance. Urethra has no lesions or masses. The cervix is bulbous.Pap with GC/CHL and high risk HPV 16/18 genotyping performed.  Uterus is felt to be normal size, shape, and contour.  No adnexal masses or tenderness noted.Bladder is non tender, no masses felt. Rectal: Good sphincter tone, no polyps, or hemorrhoids felt.  Hemoccult negative. Extremities/musculoskeletal:  No swelling or varicosities noted, no clubbing or cyanosis Psych:  No mood changes, alert and cooperative,seems happy Fall  risk is low PHQ 9 score is 2. Co exam with Terri Skains NP student  Impression and plan:   1. Encounter for gynecological examination with Papanicolaou smear of cervix Pap sent Physical in 1 year Pap in 3 if normal Labs with PCP Mammogram yearly  2. Screening for colorectal cancer  3. Hot flashes Will try low dose OCs,discussed with Dr Elonda Husky Meds ordered this encounter  Medications  . norethindrone-ethinyl estradiol (LOESTRIN FE) 1-20 MG-MCG tablet    Sig: Take 1 tablet by mouth daily.    Dispense:  1 Package    Refill:  6    Order Specific Question:   Supervising Provider    Answer:   Tania Ade H [[id number]]  Follow up in 3 months Keep check on BP at home    4. History of cervical dysplasia

## 2019-07-24 LAB — CYTOLOGY - PAP
Chlamydia: NEGATIVE
Comment: NEGATIVE
Comment: NEGATIVE
Comment: NORMAL
Diagnosis: NEGATIVE
High risk HPV: NEGATIVE
Neisseria Gonorrhea: NEGATIVE

## 2019-07-25 MED ORDER — STIOLTO RESPIMAT 2.5-2.5 MCG/ACT IN AERS: 2.0000 | INHALATION_SPRAY | Freq: Every day | RESPIRATORY_TRACT | 0 refills | Status: DC

## 2019-07-31 ENCOUNTER — Ambulatory Visit: Payer: BC Managed Care – PPO | Admitting: Internal Medicine

## 2019-08-04 ENCOUNTER — Ambulatory Visit: Payer: BC Managed Care – PPO

## 2019-08-04 ENCOUNTER — Ambulatory Visit: Payer: BC Managed Care – PPO | Attending: Internal Medicine

## 2019-08-04 DIAGNOSIS — Z23 Encounter for immunization: Secondary | ICD-10-CM

## 2019-08-04 NOTE — Progress Notes (Signed)
   Covid-19 Vaccination Clinic  Name:  Heather Mata    MRN: [id number] DOB: March 04, 1976  08/04/2019  Heather Mata was observed post Covid-19 immunization for 15 minutes without incident. She was provided with Vaccine Information Sheet and instruction to access the V-Safe system.   Heather Mata was instructed to call 911 with any severe reactions post vaccine: Marland Kitchen Difficulty breathing  . Swelling of face and throat  . A fast heartbeat  . A bad rash all over body  . Dizziness and weakness   Immunizations Administered    Name Date Dose VIS Date Route   Pfizer COVID-19 Vaccine 08/04/2019  2:53 PM 0.3 mL 05/01/2019 Intramuscular   Manufacturer: [place]   Lot: [id number]   [place]: [id number]

## 2019-08-07 ENCOUNTER — Encounter: Payer: Self-pay | Admitting: Internal Medicine

## 2019-08-07 ENCOUNTER — Ambulatory Visit: Payer: BC Managed Care – PPO | Admitting: Internal Medicine

## 2019-08-07 ENCOUNTER — Other Ambulatory Visit: Payer: Self-pay

## 2019-08-07 DIAGNOSIS — R0600 Dyspnea, unspecified: Secondary | ICD-10-CM

## 2019-08-07 DIAGNOSIS — R05 Cough: Secondary | ICD-10-CM | POA: Diagnosis not present

## 2019-08-07 DIAGNOSIS — R058 Other specified cough: Secondary | ICD-10-CM

## 2019-08-07 DIAGNOSIS — R0609 Other forms of dyspnea: Secondary | ICD-10-CM

## 2019-08-07 MED ORDER — OMEPRAZOLE 40 MG PO CPDR: DELAYED_RELEASE_CAPSULE | ORAL | Status: AC

## 2019-08-07 MED ORDER — STIOLTO RESPIMAT 2.5-2.5 MCG/ACT IN AERS: 2.0000 | INHALATION_SPRAY | Freq: Every day | RESPIRATORY_TRACT | 0 refills | Status: DC

## 2019-08-07 NOTE — Assessment & Plan Note (Signed)
Refractory symptoms since winter 2020 with marked upper airway features  - Pulmonary eval [facility] 05/31/2011 failed singualir / rec rx anxiety and gerd   - Dx of VCD by Teoh around 2015 per pt - 08/07/2019   Walked RA x two laps =  approx 511ft @ moderate pace - stopped due to end of study s sob  with sats of 93  % at the end of the study.  Chart review does not reveal any pfts in system - will do [facility] record and [facility]           Each maintenance medication was reviewed in detail including emphasizing most importantly the difference between maintenance and prns and under what circumstances the prns are to be triggered using an action plan format where appropriate.  Total time for H and P, chart review, counseling, teaching devices /  directly observing portions of ambulatory 02 saturation study/  and generating customized AVS unique to this office visit / charting = 45 min

## 2019-08-07 NOTE — Progress Notes (Signed)
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      Heather Mata dx  VCD around 2015  And by Heather Mata 05/31/2011 Pulmonary eval Heather Mata 05/31/2011 failed singualir / rec rx anxiety and gerd     History of Present Illness  07/08/2019  Pulmonary/ 1st office eval/Heather Mata  Chief Complaint  Patient presents with  . Pulmonary Consult    Former pt of Dr Heather Mata. Pt states has had Asthma all of her life. She c/o increased SOB, wheezing and cough worsening for the past year. She is using her albuterol inhaler 6-8 x per day and neb 8 x per day.   Dyspnea:  Steps ok p neb  Cough: harsh min mucoid with hoarseness corresponding to flare  Sleep: sev times per night needs neb /choaking/ urinary incont SABA use: way too much as above and anxious her saba hfa no longer working so over using neb now No obvious day to day or daytime variability or assoc excess/ purulent sputum or mucus plugs or hemoptysis or cp or   or overt sinus or hb symptoms on prilosec 40 mg qd  rec Plan A = Automatic = Always=   Stiolto 2 puffs each am  Prednisone 10 mg  2  Daily then one daily x 5 days and stop  Plan B = Backup (to supplement plan A, not to replace it) Only use your albuterol inhaler as a rescue medication  Plan C = Crisis (instead of Plan B but only if Plan B stops working) - only use your albuterol nebulizer if you first  try Plan B and it fails to help > ok to use the nebulizer up to every 4 hours but if start needing it regularly call for immediate appointment Prilosec (20 x 2) 40 mg Take 30- 60 min before your first and last meals of the day  GERD diet    08/07/2019  f/u ov/Heather Mata re: dtc asthma vs vcd - did not return with all meds in 2 bags Chief Complaint  Patient presents with  . Follow-up    Breathing is better and she is coughing less since the last visit. She is using her rescue inhaler 3 x per day and neb once per day once on average.   Dyspnea: not reproduced with ex but present at rest/ still having spells while on stiolto  Cough: better off breo 200 and on tessalon 100 x 3 in am and 2  In pm (not what was rec ) and prilosec 40 mg x 2 bid (80 bid ) ac Sleeping: 30 degrees with bed blocks  SABA use: not since 630 am (10 h prior to ov) taken 30 min after am stiolto  02: none    No obvious patterns in  day to day or daytime variability or assoc excess/  purulent sputum or mucus plugs or hemoptysis or cp or chest tightness, subjective wheeze or overt sinus or hb symptoms.    . Also denies any obvious fluctuation of symptoms with weather or environmental changes or other aggravating or alleviating factors except as outlined above   No unusual exposure hx or h/o childhood pna    Current Allergies, Complete Past Medical History, Past Surgical History, Family History, and Social History were reviewed in [employer] record.  ROS  The following are not active complaints unless bolded Hoarseness, sore throat, dysphagia, dental problems, itching, sneezing,  nasal congestion or discharge of excess mucus or purulent secretions, ear ache,   fever, chills, sweats, unintended wt loss or wt gain, classically pleuritic or exertional cp,  orthopnea pnd or arm/hand swelling  or leg swelling, presyncope, palpitations, abdominal pain, anorexia, nausea, vomiting, diarrhea  or change in bowel habits  or change in bladder habits, change in stools or change in urine, dysuria, hematuria,  rash, arthralgias, visual complaints, headache, numbness, weakness or ataxia or problems with walking or coordination,  change in mood or  memory.        Current Meds  Medication Sig  . albuterol (VENTOLIN HFA) 108 (90 Base) MCG/ACT inhaler Inhale 2 puffs into the lungs every 4 (four) hours as needed.  . ALPRAZolam (XANAX) 1 MG tablet Take 1 tablet (1 mg total) by mouth 3 (three) times daily as needed.  . benzonatate (TESSALON) 100 MG capsule Take by mouth 3 (three) times daily as needed for cough.  . clonazePAM (KLONOPIN) 1 MG tablet Take 1 mg by mouth at bedtime.  . cyclobenzaprine (FLEXERIL) 10 MG tablet Take 10 mg by mouth as needed for muscle spasms.   . DULoxetine (CYMBALTA) 20 MG capsule Take 40 mg by mouth daily.  Marland Kitchen ipratropium-albuterol (DUONEB) 0.5-2.5 (3) MG/3ML SOLN Take 3 mLs by nebulization every 6 (six) hours as needed (wheezing/shortness of breath).   . Misc. Devices MISC Handheld portable nebulizer machine  . norethindrone-ethinyl estradiol (LOESTRIN FE) 1-20 MG-MCG tablet Has not started yet, says will take for hot flashes   . omeprazole (PRILOSEC) 40 MG capsule Take 40 mg taking 2 ac bid    . promethazine (PHENERGAN) 25 MG tablet Take 25 mg by mouth every 6 (six) hours as needed for nausea or vomiting.  . Tiotropium Bromide-Olodaterol (STIOLTO RESPIMAT) 2.5-2.5 MCG/ACT AERS Inhale 2 puffs into the lungs daily.            Past Medical History:  Diagnosis Date  . Abdominal bloating associated with menstruation 04/08/2015  . Anxiety   . Asthma   . Cancer (HCC)    cervical dysplasia  . Cervical dysplasia   . Chronic low back pain 01/27/2015  . GERD (gastroesophageal reflux disease)   . History of cervical dysplasia 04/08/2015   Had LEEP 2 x  . Hypertension   . Reflux    laryngotracheal   . 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 Vaginal Pap smear, abnormal          Objective:    Obese amb wf/ min hoarse   Wt Readings from Last 3 Encounters:  08/07/19 220 lb 12.8 oz (100.2 kg)  07/21/19 218 lb (98.9 kg)  07/08/19 225 lb (102.1 kg)     Vital signs reviewed - Note on arrival 02 sats  97% on RA   HEENT : pt wearing mask not removed for exam due to covid -19 concerns.    NECK :  without JVD/Nodes/TM/ nl carotid upstrokes bilaterally   LUNGS: no acc muscle use,  Nl contour chest which is clear to A and P bilaterally without cough on insp or exp maneuvers   CV:  RRR  no s3 or murmur or increase in P2, and no edema   ABD:  soft and nontender with nl inspiratory excursion in the supine position. No bruits or organomegaly appreciated, bowel sounds nl  MS:  Nl gait/ ext warm without deformities, calf tenderness, cyanosis or clubbing No obvious joint restrictions   SKIN: warm and dry without lesions    NEURO:  alert, approp, nl sensorium with  no motor or cerebellar deficits apparent.         I personally reviewed images and agree with radiology impression as follows:  CXR:  PA and Lat 07/08/19  No active cardiopulmonary disease.    Assessment

## 2019-08-07 NOTE — Assessment & Plan Note (Signed)
Dx of VCD by Benjamine Mola around 2015  - Allergy profile 12/17/2018  >  Eos 0.2 /  IgE  11 - d/c breo 200 07/08/2019 and started stiolto 2 each am so as not to aggravate VCD  - 08/07/2019  After extensive coaching inhaler device,  effectiveness =    75% with hfa and 90% with smi > continue stiolto 2 each am for now x 2 samples > f/u in 4 weeks   Extremely challenging situation with inconsitent histories taken on separate dates reflects    difficult airways management  ddx almost all start with A and  include Adherence, Ace Inhibitors, Acid Reflux, Active Sinus Disease, Alpha 1 Antitripsin deficiency, Anxiety masquerading as Airways dz,  ABPA,  Allergy(esp in young), Aspiration (esp in elderly), Adverse effects of meds,  Active smoking or vaping, A bunch of PE's (a small clot burden can't cause this syndrome unless there is already severe underlying pulm or vascular dz with poor reserve) plus two Bs  = Bronchiectasis and Beta blocker use..and one C= CHF   Adherence is always the initial "prime suspect" and is a multilayered concern that requires a "trust but verify" approach in every patient - starting with knowing how to use medications, especially inhalers, correctly, keeping up with refills and understanding the fundamental difference between maintenance and prns vs those medications only taken for a very short course and then stopped and not refilled.  - see hfa/ smi teaching - with all meds in hand using a trust but verify approach to confirm accurate Medication  Reconciliation The principal here is that until we are certain that the  patients are doing what we've asked, it makes no sense to ask them to do more.  To keep things simple, I have asked the patient to first separate medicines that are perceived as maintenance, that is to be taken daily "no matter what", from those medicines that are taken on only on an as-needed basis and I have given the patient examples of both, and then return to see me to sort out  what exactly she is taking.  ? Acid (or non-acid) GERD > always difficult to exclude as up to 75% of pts in some series report no assoc GI/ Heartburn symptoms> rec max (24h)  acid suppression and diet restrictions/ reviewed and instructions given in writing. - Of the three most common causes of  Sub-acute / recurrent or chronic cough, only one (GERD)  can actually contribute to/ trigger  the other two (asthma and post nasal drip syndrome)  and perpetuate the cylce of cough. - beware effects of progesterone in bcps on LES / advised avoid if possible   While not intuitively obvious, many patients with chronic low grade reflux do not cough until there is a primary insult that disturbs the protective epithelial barrier and exposes sensitive nerve endings.   This is typically viral but can due to PNDS and  either may apply here.   The point is that once this occurs, it is difficult to eliminate the cycle  using anything but a maximally effective acid suppression regimen at least in the short run, accompanied by an appropriate diet to address non acid GERD and control / eliminate the cough itself with tessalon 200mg  up to every 6 hours as needed     ? Asthma/ allergy  >  allergy profile unimpressive as has been response to appropr asthma rx  To date which likely are just aggravating the vcd component so since she  insists "combivent works the best of anything" have advised stay on stiolto for now and low theshold to add back short course prednisone if really has a flare of asthma as apparently already failed ICS and singulair  ABPA ruled out with nl eos/ IgE levels.   ? Anxiety > usually at the bottom of this list of usual suspects but should be much higher on this pt's based on H and P and note already on psychotropics and may interfere with adherence and also interpretation of response or lack thereof to symptom management which can be quite subjective.  The more I talked to her the more I became concerned  re misuse of meds/ misunderstanding of maint vs prn and risk of morbiditity /mortality in this setting but nothing more to offer until/ unless she convinces me she can meet me half way in terms of accurate/complete med reconciliation using the two bag approach she failed to do on this ov.

## 2019-08-07 NOTE — Patient Instructions (Addendum)
Beware that any form of progesterone can cause worse reflux   Omeprazole is 40 mg Take 30- 60 min before your first and last meals of the day   Continue stiolto for now at 2 puff each am   For use your albuterol as a rescue medication to be used if you can't catch your breath by resting or doing a relaxed purse lip breathing pattern.  - The less you use it, the better it will work when you need it. - Ok to use up to 2 puffs  every 4 hours if you must but call for immediate appointment if use goes up over your usual need - Don't leave home without it !!  (think of it like the spare tire for your car)   If not better or not tolerating albuterol spray, use the nebulized albuterol up to every 4 hours as needed  For cough tessalon 200 (2 x100)  mg up to every 6 hours if needed    Please schedule a follow up office visit in 4-5  weeks, call sooner if needed with all medications /inhalers/ solutions in hand so we can verify exactly what you are taking. This includes all medications from all doctors and over the [place] separate them into two bags:  the ones you take automatically, no matter what, vs the ones you take just when you feel you need them "BAG #2 is UP TO YOU"  - this will really help [place] help you take your medications more effectively.    In [place] office

## 2019-09-08 ENCOUNTER — Telehealth: Payer: Self-pay | Admitting: Internal Medicine

## 2019-09-08 MED ORDER — STIOLTO RESPIMAT 2.5-2.5 MCG/ACT IN AERS: 2.0000 | INHALATION_SPRAY | Freq: Every day | RESPIRATORY_TRACT | 11 refills | Status: DC

## 2019-09-08 NOTE — Telephone Encounter (Signed)
Called and spoke with pt who stated she was needing a rx of stiolto to be sent to pharmacy. Verified pt's preferred pharmacy and sent rx for stiolto there for pt. Nothing further needed.

## 2019-09-09 ENCOUNTER — Other Ambulatory Visit: Payer: Self-pay | Admitting: Obstetrics & Gynecology

## 2019-10-16 ENCOUNTER — Ambulatory Visit (INDEPENDENT_AMBULATORY_CARE_PROVIDER_SITE_OTHER): Payer: BC Managed Care – PPO | Admitting: Orthopedic Surgery

## 2019-10-16 ENCOUNTER — Encounter: Payer: Self-pay | Admitting: Orthopedic Surgery

## 2019-10-16 ENCOUNTER — Other Ambulatory Visit: Payer: Self-pay

## 2019-10-16 VITALS — BP 144/86 | HR 118 | Ht 61.0 in | Wt 218.0 lb

## 2019-10-16 DIAGNOSIS — R200 Anesthesia of skin: Secondary | ICD-10-CM

## 2019-10-16 DIAGNOSIS — G5603 Carpal tunnel syndrome, bilateral upper limbs: Secondary | ICD-10-CM | POA: Diagnosis not present

## 2019-10-16 DIAGNOSIS — Z6841 Body Mass Index (BMI) 40.0 and over, adult: Secondary | ICD-10-CM

## 2019-10-16 MED ORDER — DICLOFENAC POTASSIUM 50 MG PO TABS: 50.0000 mg | ORAL_TABLET | Freq: Two times a day (BID) | ORAL | 3 refills | Status: DC

## 2019-10-16 MED ORDER — GABAPENTIN 100 MG PO CAPS: 100.0000 mg | ORAL_CAPSULE | ORAL | 2 refills | Status: DC

## 2019-10-16 NOTE — Progress Notes (Signed)
  Chief Complaint  Patient presents with  . 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  Symptoms present 3 to 4 months.  Prior treatment includes bracing and meloxicam.  Review of systems pain is also in the forearm and right and left elbow.  She notes difficulty picking things up she is having trouble holding onto things.  She has no risk factors such as high parathyroidism or hypothyroidism no rheumatoid arthritis no diabetes not a smoker  Review of Systems  All other systems reviewed and are negative.  Past Medical History:  Diagnosis Date  . Abdominal bloating associated with menstruation 04/08/2015  . Anxiety   . Asthma   . Cancer (HCC)    cervical dysplasia  . Cervical dysplasia   . Chronic low back pain 01/27/2015  . GERD (gastroesophageal reflux disease)   . History of cervical dysplasia 04/08/2015   Had LEEP 2 x  . Hypertension   . Reflux    laryngotracheal   . 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 Vaginal Pap smear, abnormal    Past Surgical History:  Procedure Laterality Date  . BACK SURGERY     ablation  . CHOLECYSTECTOMY     age 39, non functioning  . ESOPHAGOGASTRODUODENOSCOPY N/A 06/17/2014   Procedure: ESOPHAGOGASTRODUODENOSCOPY (EGD);  Surgeon: Rogene Houston, MD;  Location: [facility];  Service: Endoscopy;  Laterality: N/A;  830 - moved to 1/28 @ 9:30 - Ann notified pt  . LEEP     x 2   Social History   Tobacco Use  . Smoking status: Never Smoker  . Smokeless tobacco: Never Used  Substance Use Topics  . Alcohol use: Yes    Comment: occasionally rare  . Drug use: No   Family History  Problem Relation Age of Onset  . Cancer Father        kidney  . Cancer Paternal Grandmother        brain  . Aneurysm Paternal Grandfather     BP (!) 144/86    Pulse (!) 118   Ht 5\' 1"  (1.549 m)   Wt 218 lb (98.9 kg)   BMI 41.19 kg/m   Normal development grooming and hygiene awake alert and oriented x3 mood and affect normal gait and station unremarkable  Right and left upper extremity reveals symptoms right worse than the left but very similar  Tenderness over the carpal tunnel positive compression test positive Tinel's test decreased sensation thumb index long and ring normal sensation small tenderness right and left lateral epicondyle full range of motion weak grip neurovascular exam color capillary refill temperature normal  The patient has symptoms strong enough to warrant immediate surgery however, she says she can have surgery now would like injections  I informed her that I do not do a lot of carpal tunnel injections because they are difficult to do  She said that she wanted to proceed anyway and we did and this injections went fairly well she did have some tingling electric-like sensations during the injection which required repositioning of the needle

## 2019-10-16 NOTE — Patient Instructions (Addendum)
Carpal Tunnel Syndrome  Carpal tunnel syndrome is a condition that causes pain in your hand and arm. The carpal tunnel is a narrow area that is on the palm side of your wrist. Repeated wrist motion or certain diseases may cause swelling in the tunnel. This swelling can pinch the main nerve in the wrist (median nerve). What are the causes? This condition may be caused by:  Repeated wrist motions.  Wrist injuries.  Arthritis.  A sac of fluid (cyst) or abnormal growth (tumor) in the carpal tunnel.  Fluid buildup during pregnancy. Sometimes the cause is not known. What increases the risk? The following factors may make you more likely to develop this condition:  Having a job in which you move your wrist in the same way many times. This includes jobs like being a Software engineer or a Scientist, water quality.  Being a woman.  Having other health conditions, such as: ? Diabetes. ? Obesity. ? A thyroid gland that is not active enough (hypothyroidism). ? Kidney failure. What are the signs or symptoms? Symptoms of this condition include:  A tingling feeling in your fingers.  Tingling or a loss of feeling (numbness) in your hand.  Pain in your entire arm. This pain may get worse when you bend your wrist and elbow for a long time.  Pain in your wrist that goes up your arm to your shoulder.  Pain that goes down into your palm or fingers.  A weak feeling in your hands. You may find it hard to grab and hold items. You may feel worse at night. How is this diagnosed? This condition is diagnosed with a medical history and physical exam. You may also have tests, such as:  Electromyogram (EMG). This test checks the signals that the nerves send to the muscles.  Nerve conduction study. This test checks how well signals pass through your nerves.  Imaging tests, such as X-rays, ultrasound, and MRI. These tests check for what might be the cause of your condition. How is this treated? This condition may be  treated with:  Lifestyle changes. You will be asked to stop or change the activity that caused your problem.  Doing exercise and activities that make bones and muscles stronger (physical therapy).  Learning how to use your hand again (occupational therapy).  Medicines for pain and swelling (inflammation). You may have injections in your wrist.  A wrist splint.  Surgery. Follow these instructions at home: If you have a splint:  Wear the splint as told by your doctor. Remove it only as told by your doctor.  Loosen the splint if your fingers: ? Tingle. ? Lose feeling (become numb). ? Turn cold and blue.  Keep the splint clean.  If the splint is not waterproof: ? Do not let it get wet. ? Cover it with a watertight covering when you take a bath or a shower. Managing pain, stiffness, and swelling   If told, put ice on the painful area: ? If you have a removable splint, remove it as told by your doctor. ? Put ice in a plastic bag. ? Place a towel between your skin and the bag. ? Leave the ice on for 20 minutes, 2-3 times per day. General instructions  Take over-the-counter and prescription medicines only as told by your doctor.  Rest your wrist from any activity that may cause pain. If needed, talk with your boss at work about changes that can help your wrist heal.  Do any exercises as told by your doctor,  physical therapist, or occupational therapist.  Keep all follow-up visits as told by your doctor. This is important. Contact a doctor if:  You have new symptoms.  Medicine does not help your pain.  Your symptoms get worse. Get help right away if:  You have very bad numbness or tingling in your wrist or hand. Summary  Carpal tunnel syndrome is a condition that causes pain in your hand and arm.  It is often caused by repeated wrist motions.  Lifestyle changes and medicines are used to treat this problem. Surgery may help in very bad cases.  Follow your doctor's  instructions about wearing a splint, resting your wrist, keeping follow-up visits, and calling for help. This information is not intended to replace advice given to you by your health care provider. Make sure you discuss any questions you have with your health care provider. Document Revised: 09/13/2017 Document Reviewed: 09/13/2017 Elsevier Patient Education  [place].  Tennis Elbow Tennis elbow is swelling (inflammation) in your outer forearm, near your elbow. Swelling affects the tissues that connect muscle to bone (tendons). Tennis elbow can happen in any sport or job in which you use your elbow too much. It is caused by doing the same motion over and over. Tennis elbow can cause:  Pain and tenderness in your forearm and the outer part of your elbow. You may have pain all the time, or only when using the arm.  A burning feeling. This runs from your elbow through your arm.  Weak grip in your hand. Follow these instructions at home: Activity  Rest your elbow and wrist. Avoid activities that cause problems, as told by your doctor.  If told by your doctor, wear an elbow strap to reduce stress on the area.  Do physical therapy exercises as told.  If you lift an object, lift it with your palm facing up. This is easier on your elbow. Lifestyle  If your tennis elbow is caused by sports, check your equipment and make sure that: ? You are using it correctly. ? It fits you well.  If your tennis elbow is caused by work or by using a computer, take breaks often to stretch your arm. Talk with your manager about how you can manage your condition at work. If you have a brace:  Wear the brace as told by your doctor. Remove it only as told by your doctor.  Loosen the brace if your fingers tingle, get numb, or turn cold and blue.  Keep the brace clean.  If the brace is not waterproof, ask your doctor if you may take the brace off for bathing. If you must keep the brace on while  bathing: ? Do not let it get wet. ? Cover it with a watertight covering when you take a bath or a shower. General instructions   If told, put ice on the painful area: ? Put ice in a plastic bag. ? Place a towel between your skin and the bag. ? Leave the ice on for 20 minutes, 2-3 times a day.  Take over-the-counter and prescription medicines only as told by your doctor.  Keep all follow-up visits as told by your doctor. This is important. Contact a doctor if:  Your pain does not get better with treatment.  Your pain gets worse.  You have weakness in your forearm, hand, or fingers.  You cannot feel your forearm, hand, or fingers. Summary  Tennis elbow is swelling (inflammation) in your outer forearm, near your elbow.  Tennis elbow is caused by doing the same motion over and over.  Rest your elbow and wrist. Avoid activities that cause problems, as told by your doctor.  If told, put ice on the painful area for 20 minutes, 2-3 times a day. This information is not intended to replace advice given to you by your health care provider. Make sure you discuss any questions you have with your health care provider. Document Revised: 01/31/2018 Document Reviewed: 02/19/2017 Elsevier Patient Education  [place].

## 2019-10-23 ENCOUNTER — Ambulatory Visit: Payer: BC Managed Care – PPO | Admitting: Adult Health

## 2019-11-05 ENCOUNTER — Encounter: Payer: Self-pay | Admitting: Orthopedic Surgery

## 2019-11-05 ENCOUNTER — Ambulatory Visit: Payer: BC Managed Care – PPO | Admitting: Orthopedic Surgery

## 2019-11-05 ENCOUNTER — Other Ambulatory Visit: Payer: Self-pay

## 2019-11-05 VITALS — BP 149/97 | HR 107 | Ht 61.0 in | Wt 220.0 lb

## 2019-11-05 DIAGNOSIS — Z6841 Body Mass Index (BMI) 40.0 and over, adult: Secondary | ICD-10-CM

## 2019-11-05 DIAGNOSIS — R200 Anesthesia of skin: Secondary | ICD-10-CM | POA: Diagnosis not present

## 2019-11-05 DIAGNOSIS — G5603 Carpal tunnel syndrome, bilateral upper limbs: Secondary | ICD-10-CM

## 2019-11-05 MED ORDER — MELOXICAM 7.5 MG PO TABS: 7.5000 mg | ORAL_TABLET | Freq: Every day | ORAL | 5 refills | Status: DC

## 2019-11-05 NOTE — Patient Instructions (Signed)
As we discussed bracing at night  Start meloxicam once a day  3 weeks follow-up for possible injection

## 2019-11-05 NOTE — Progress Notes (Signed)
Chief Complaint  Patient presents with  . Carpal Tunnel    injections helped     Heather Mata has bilateral carpal tunnel got good response from the bilateral carpal tunnel injections but she stopped bracing and the gabapentin and Cataflam caused a reaction I would call it an allergy but a reaction to the medicine and she stopped it  She started to get numbness and tingling again the forearms feel good  Recommend switch to meloxicam bracing at night repeat injection in 3 weeks if needed  Encounter Diagnoses  Name Primary?  . Bilateral hand numbness Yes  . Body mass index 40.0-44.9, adult ([place])   . Morbid obesity ([place])    Chronic bilateral carpal tunnel syndrome with medication management  The patient meets the AMA guidelines for Morbid (severe) obesity with a BMI > 40.0 and I have recommended weight loss.  [place] Plan Meds ordered this encounter  Medications  . meloxicam (MOBIC) 7.5 MG tablet    Sig: Take 1 tablet (7.5 mg total) by mouth daily.    Dispense:  30 tablet    Refill:  5

## 2019-11-10 ENCOUNTER — Ambulatory Visit: Payer: BC Managed Care – PPO | Admitting: Internal Medicine

## 2019-11-26 ENCOUNTER — Ambulatory Visit: Payer: BC Managed Care – PPO | Admitting: Orthopedic Surgery

## 2019-11-26 ENCOUNTER — Other Ambulatory Visit: Payer: Self-pay

## 2019-11-26 ENCOUNTER — Encounter: Payer: Self-pay | Admitting: Orthopedic Surgery

## 2019-11-26 VITALS — BP 134/83 | HR 110 | Ht 61.0 in | Wt 222.2 lb

## 2019-11-26 DIAGNOSIS — Z6841 Body Mass Index (BMI) 40.0 and over, adult: Secondary | ICD-10-CM

## 2019-11-26 DIAGNOSIS — R200 Anesthesia of skin: Secondary | ICD-10-CM

## 2019-11-26 NOTE — Progress Notes (Signed)
Chief Complaint  Patient presents with  . Carpal Tunnel    both hands 3 wk follow up    44 year old female with history of bilateral carpal tunnel syndrome.  She is a court reporter.  She has had 1 injection in the carpal tunnel with good result she is worn splints she would like the injections repeated  She still considering surgery having severe throbbing pain and aching numbness and tingling but she does not want the surgery until may be September  Procedure note for right carpal tunnel injection  The patient gave verbal consent for right carpal tunnel injection this was confirmed with timeout the skin was prepped with alcohol and ethyl chloride 40 mg of Depo-Medrol and 1 cc of lidocaine 1% was injected into the right carpal tunnel    We repeated this procedure for the left carpal tunnel  Encounter Diagnoses  Name Primary?  . Bilateral hand numbness Yes  . Body mass index 40.0-44.9, adult ([place])   . Morbid obesity (HCC)     BP 134/83   Pulse (!) 110   Ht 5\' 1"  (1.549 m)   Wt 222 lb 3.2 oz (100.8 kg)   BMI 41.98 kg/m   The patient meets the AMA guidelines for Morbid (severe) obesity with a BMI > 40.0 and I have recommended weight loss.

## 2019-12-17 ENCOUNTER — Ambulatory Visit: Payer: BC Managed Care – PPO | Admitting: Orthopedic Surgery

## 2019-12-17 ENCOUNTER — Encounter: Payer: Self-pay | Admitting: Orthopedic Surgery

## 2019-12-17 ENCOUNTER — Other Ambulatory Visit: Payer: Self-pay

## 2019-12-17 VITALS — BP 140/73 | HR 101 | Ht 61.0 in | Wt 222.0 lb

## 2019-12-17 DIAGNOSIS — R200 Anesthesia of skin: Secondary | ICD-10-CM

## 2019-12-17 DIAGNOSIS — Z6841 Body Mass Index (BMI) 40.0 and over, adult: Secondary | ICD-10-CM

## 2019-12-17 NOTE — Progress Notes (Signed)
Chief Complaint  Patient presents with  . Carpal Tunnel    bilateral     44 year old with bilateral carpal tunnel syndrome status post several injections.  She has had carpal tunnel for several years.  She says that she has to have something done she cannot tolerate it anymore she is having trouble sleeping at night  Past Medical History:  Diagnosis Date  . Abdominal bloating associated with menstruation 04/08/2015  . Anxiety   . Asthma   . Cancer (HCC)    cervical dysplasia  . Cervical dysplasia   . Chronic low back pain 01/27/2015  . GERD (gastroesophageal reflux disease)   . History of cervical dysplasia 04/08/2015   Had LEEP 2 x  . Hypertension   . Reflux    laryngotracheal   . 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 Vaginal Pap smear, abnormal    Past Surgical History:  Procedure Laterality Date  . BACK SURGERY     ablation  . CHOLECYSTECTOMY     age 60, non functioning  . ESOPHAGOGASTRODUODENOSCOPY N/A 06/17/2014   Procedure: ESOPHAGOGASTRODUODENOSCOPY (EGD);  Surgeon: Rogene Houston, MD;  Location: [facility];  Service: Endoscopy;  Laterality: N/A;  830 - moved to 1/28 @ 9:30 - Ann notified pt  . LEEP     x 2   Social History   Socioeconomic History  . Marital status: Single    Spouse name: Not on file  . Number of children: 1  . Years of education: college  . Highest education level: Not on file  Occupational History  . Occupation: Paralegal  Tobacco Use  . Smoking status: Never Smoker  . Smokeless tobacco: Never Used  Vaping Use  . Vaping Use: Never used  Substance and Sexual Activity  . Alcohol use: Yes    Comment: occasionally rare  . Drug use: No  . Sexual activity: Yes    Birth control/protection: Surgical    Comment: partner had vasectomy  Other Topics Concern  . Not on file  Social History Narrative   Lives at home w/ dad and daughter   Patient drinks about 1 cup of coffee daily.   Patient is left handed.   Social  Determinants of Health   Financial Resource Strain:   . Difficulty of Paying Living Expenses:   Food Insecurity:   . Worried About Charity fundraiser in the Last Year:   . Arboriculturist in the Last Year:   Transportation Needs:   . Film/video editor (Medical):   Marland Kitchen Lack of Transportation (Non-Medical):   Physical Activity:   . Days of Exercise per Week:   . Minutes of Exercise per Session:   Stress:   . Feeling of Stress :   Social Connections:   . Frequency of Communication with Friends and Family:   . Frequency of Social Gatherings with Friends and Family:   . Attends Religious Services:   . Active Member of Clubs or Organizations:   . Attends Archivist Meetings:   Marland Kitchen Marital Status:   Intimate Partner Violence:   . Fear of Current or Ex-Partner:   . Emotionally Abused:   Marland Kitchen Physically Abused:   . Sexually Abused:    Family History  Problem Relation Age of Onset  . Cancer Father        kidney  . Cancer Paternal Grandmother        brain  . Aneurysm Paternal Grandfather    Encounter Diagnoses  Name Primary?  . Bilateral hand numbness Yes  . Body mass index 40.0-44.9, adult ([place])   . Morbid obesity ([place])     I went over the risk benefits of the surgery expected outcomes  She wants to be out of work about 10 days which is fine  She will return to work on a limited duty in terms of typing  We will order her an ergonomic keyboard when she returns for typing  The procedure has been fully reviewed with the patient; The risks and benefits of surgery have been discussed and explained and understood. Alternative treatment has also been reviewed, questions were encouraged and answered. The postoperative plan is also been reviewed.  General anesthesia for bilateral carpal tunnel releases  Chronic problem surgical decision

## 2019-12-17 NOTE — Patient Instructions (Addendum)
Government requires we give recommendations for weight loss  Calorie Counting for Weight Loss Calories are units of energy. Your body needs a certain amount of calories from food to keep you going throughout the day. When you eat more calories than your body needs, your body stores the extra calories as fat. When you eat fewer calories than your body needs, your body burns fat to get the energy it needs. Calorie counting means keeping track of how many calories you eat and drink each day. Calorie counting can be helpful if you need to lose weight. If you make sure to eat fewer calories than your body needs, you should lose weight. Ask your health care provider what a healthy weight is for you. For calorie counting to work, you will need to eat the right number of calories in a day in order to lose a healthy amount of weight per week. A dietitian can help you determine how many calories you need in a day and will give you suggestions on how to reach your calorie goal.  A healthy amount of weight to lose per week is usually 1-2 lb (0.5-0.9 kg). This usually means that your daily calorie intake should be reduced by 500-750 calories.  Eating 1,200 - 1,500 calories per day can help most women lose weight.  Eating 1,500 - 1,800 calories per day can help most men lose weight. What is my plan? My goal is to have __________ calories per day. If I have this many calories per day, I should lose around __________ pounds per week. What do I need to know about calorie counting? In order to meet your daily calorie goal, you will need to:  Find out how many calories are in each food you would like to eat. Try to do this before you eat.  Decide how much of the food you plan to eat.  Write down what you ate and how many calories it had. Doing this is called keeping a food log. To successfully lose weight, it is important to balance calorie counting with a healthy lifestyle that includes regular activity. Aim  for 150 minutes of moderate exercise (such as walking) or 75 minutes of vigorous exercise (such as running) each week. Where do I find calorie information?  The number of calories in a food can be found on a Nutrition Facts label. If a food does not have a Nutrition Facts label, try to look up the calories online or ask your dietitian for help. Remember that calories are listed per serving. If you choose to have more than one serving of a food, you will have to multiply the calories per serving by the amount of servings you plan to eat. For example, the label on a package of bread might say that a serving size is 1 slice and that there are 90 calories in a serving. If you eat 1 slice, you will have eaten 90 calories. If you eat 2 slices, you will have eaten 180 calories. How do I keep a food log? Immediately after each meal, record the following information in your food log:  What you ate. Don't forget to include toppings, sauces, and other extras on the food.  How much you ate. This can be measured in cups, ounces, or number of items.  How many calories each food and drink had.  The total number of calories in the meal. Keep your food log near you, such as in a small notebook in your pocket, or use  a mobile app or website. Some programs will calculate calories for you and show you how many calories you have left for the day to meet your goal. What are some calorie counting tips?   Use your calories on foods and drinks that will fill you up and not leave you hungry: ? Some examples of foods that fill you up are nuts and nut butters, vegetables, lean proteins, and high-fiber foods like whole grains. High-fiber foods are foods with more than 5 g fiber per serving. ? Drinks such as sodas, specialty coffee drinks, alcohol, and juices have a lot of calories, yet do not fill you up.  Eat nutritious foods and avoid empty calories. Empty calories are calories you get from foods or beverages that do  not have many vitamins or protein, such as candy, sweets, and soda. It is better to have a nutritious high-calorie food (such as an avocado) than a food with few nutrients (such as a bag of chips).  Know how many calories are in the foods you eat most often. This will help you calculate calorie counts faster.  Pay attention to calories in drinks. Low-calorie drinks include water and unsweetened drinks.  Pay attention to nutrition labels for "low fat" or "fat free" foods. These foods sometimes have the same amount of calories or more calories than the full fat versions. They also often have added sugar, starch, or salt, to make up for flavor that was removed with the fat.  Find a way of tracking calories that works for you. Get creative. Try different apps or programs if writing down calories does not work for you. What are some portion control tips?  Know how many calories are in a serving. This will help you know how many servings of a certain food you can have.  Use a measuring cup to measure serving sizes. You could also try weighing out portions on a kitchen scale. With time, you will be able to estimate serving sizes for some foods.  Take some time to put servings of different foods on your favorite plates, bowls, and cups so you know what a serving looks like.  Try not to eat straight from a bag or box. Doing this can lead to overeating. Put the amount you would like to eat in a cup or on a plate to make sure you are eating the right portion.  Use smaller plates, glasses, and bowls to prevent overeating.  Try not to multitask (for example, watch TV or use your computer) while eating. If it is time to eat, sit down at a table and enjoy your food. This will help you to know when you are full. It will also help you to be aware of what you are eating and how much you are eating. What are tips for following this plan? Reading food labels  Check the calorie count compared to the serving  size. The serving size may be smaller than what you are used to eating.  Check the source of the calories. Make sure the food you are eating is high in vitamins and protein and low in saturated and trans fats. Shopping  Read nutrition labels while you shop. This will help you make healthy decisions before you decide to purchase your food.  Make a grocery list and stick to it. Cooking  Try to cook your favorite foods in a healthier way. For example, try baking instead of frying.  Use low-fat dairy products. Meal planning  Use more fruits  and vegetables. Half of your plate should be fruits and vegetables.  Include lean proteins like poultry and fish. How do I count calories when eating out?  Ask for smaller portion sizes.  Consider sharing an entree and sides instead of getting your own entree.  If you get your own entree, eat only half. Ask for a box at the beginning of your meal and put the rest of your entree in it so you are not tempted to eat it.  If calories are listed on the menu, choose the lower calorie options.  Choose dishes that include vegetables, fruits, whole grains, low-fat dairy products, and lean protein.  Choose items that are boiled, broiled, grilled, or steamed. Stay away from items that are buttered, battered, fried, or served with cream sauce. Items labeled "crispy" are usually fried, unless stated otherwise.  Choose water, low-fat milk, unsweetened iced tea, or other drinks without added sugar. If you want an alcoholic beverage, choose a lower calorie option such as a glass of wine or light beer.  Ask for dressings, sauces, and syrups on the side. These are usually high in calories, so you should limit the amount you eat.  If you want a salad, choose a garden salad and ask for grilled meats. Avoid extra toppings like bacon, cheese, or fried items. Ask for the dressing on the side, or ask for olive oil and vinegar or lemon to use as dressing.  Estimate how  many servings of a food you are given. For example, a serving of cooked rice is  cup or about the size of half a baseball. Knowing serving sizes will help you be aware of how much food you are eating at restaurants. The list below tells you how big or small some common portion sizes are based on everyday objects: ? 1 oz--4 stacked dice. ? 3 oz--1 deck of cards. ? 1 tsp--1 die. ? 1 Tbsp-- a ping-pong ball. ? 2 Tbsp--1 ping-pong ball. ?  cup-- baseball. ? 1 cup--1 baseball. Summary  Calorie counting means keeping track of how many calories you eat and drink each day. If you eat fewer calories than your body needs, you should lose weight.  A healthy amount of weight to lose per week is usually 1-2 lb (0.5-0.9 kg). This usually means reducing your daily calorie intake by 500-750 calories.  The number of calories in a food can be found on a Nutrition Facts label. If a food does not have a Nutrition Facts label, try to look up the calories online or ask your dietitian for help.  Use your calories on foods and drinks that will fill you up, and not on foods and drinks that will leave you hungry.  Use smaller plates, glasses, and bowls to prevent overeating. This information is not intended to replace advice given to you by your health care provider. Make sure you discuss any questions you have with your health care provider. Document Revised: 01/24/2018 Document Reviewed: 04/06/2016 Elsevier Patient Education  [employer].  Open Carpal Tunnel Release  Open carpal tunnel release is a surgery to relieve symptoms caused by carpal tunnel syndrome. The carpal tunnel is a narrow, hollow space in the wrist. It is located between the wrist bones and a band of connective tissue (transverse carpal ligament, also known as the flexor retinaculum). The nerve that supplies most of the hand (median nerve) passes through the carpal tunnel, and so do tissues that connect bones to muscles (tendons) in the  hand  and arm. Carpal tunnel syndrome makes this space swell and become narrow. The swelling pinches the median nerve and causes pain and numbness. During carpal tunnel release surgery, the transverse carpal ligament is cut to make more room in the carpal tunnel space. This also lessens the pressure on the median nerve. You may have this surgery if other types of treatment have not relieved your carpal tunnel symptoms. This surgery is usually done only for the hand that you use more often (dominant hand), but it may be done for both hands depending on your symptoms. Tell a health care provider about:  Any allergies you have.  All medicines you are taking, including vitamins, herbs, eye drops, creams, and over-the-counter medicines.  Any problems you or family members have had with anesthetic medicines.  Any blood disorders you have.  Any surgeries you have had.  Any medical conditions you have.  Whether you are pregnant or may be pregnant. What are the risks? Generally, this is a safe procedure. However, problems may occur, including:  Infection.  Bleeding.  Injury to the median nerve.  Allergic reactions to medicines.  The surgery failing to relieve your symptoms, or making your symptoms worse. What happens before the procedure? Medicines  Ask your health care provider about: ? Changing or stopping your regular medicines. This is especially important if you are taking diabetes medicines or blood thinners. ? Taking medicines such as aspirin and ibuprofen. These medicines can thin your blood. Do not take these medicines unless your health care provider tells you to take them. ? Taking over-the-counter medicines, vitamins, herbs, and supplements.  You may be given antibiotic medicine to help prevent infection. Staying hydrated Follow instructions from your health care provider about hydration, which may include:  Up to 2 hours before the procedure - you may continue to drink  clear liquids, such as water, clear fruit juice, black coffee, and plain tea. Eating and drinking restrictions Follow instructions from your health care provider about eating and drinking, which may include:  8 hours before the procedure - stop eating heavy meals or foods such as meat, fried foods, or fatty foods.  6 hours before the procedure - stop eating light meals or foods, such as toast or cereal.  6 hours before the procedure - stop drinking milk or drinks that contain milk.  2 hours before the procedure - stop drinking clear liquids. General instructions  Ask your health care provider how your surgical site will be marked or identified.  You may be asked to shower with a germ-killing soap.  Plan to have someone take you home from the hospital or clinic.  Plan to have a responsible adult care for you for at least 24 hours after you leave the hospital or clinic. This is important. What happens during the procedure?  To lower your risk of infection: ? Your health care team will wash or sanitize their hands. ? Hair may be removed from the surgical area. ? Your arm, hand, and wrist will be cleaned with a germ-killing (antiseptic) solution.  An IV will be inserted into one of your veins.  You will be given one of the following: ? A medicine to numb the wrist area (local anesthetic). You may also be given a medicine to help you relax (sedative). ? A medicine to make you fall asleep (general anesthetic).  An incision will be made in your wrist, on the same side as your palm.  The skin of your wrist will be spread  to expose the transverse carpal ligament.  The transverse carpal ligament will be cut to make more room in the carpal tunnel space.  Your incision will be closed with stitches (sutures) or staples.  A bandage (dressing) will be placed over your wrist and wrapped around your hand and wrist. The procedure may vary among health care providers and hospitals. What  happens after the procedure?  Your blood pressure, heart rate, breathing rate, and blood oxygen level will be monitored until the medicines you were given have worn off.  You will be given pain medicine as needed.  A splint or brace may be placed over your dressing, to hold your hand and wrist in place while you heal.  Do not drive until your health care provider approves. Summary  Carpal tunnel release is a surgery to relieve pain and numbness in the hand caused by swelling around a nerve (carpal tunnel syndrome).  You may have this surgery if other types of treatment have not relieved your carpal tunnel symptoms.  During carpal tunnel release surgery, a band of connective tissue (transverse carpal ligament) is cut to make more room in the carpal tunnel space. This information is not intended to replace advice given to you by your health care provider. Make sure you discuss any questions you have with your health care provider. Document Revised: 04/19/2017 Document Reviewed: 01/14/2017 Elsevier Patient Education  2020 [employer].

## 2019-12-29 ENCOUNTER — Telehealth: Payer: Self-pay | Admitting: Orthopedic Surgery

## 2019-12-29 NOTE — Telephone Encounter (Signed)
Procedure cancelled.

## 2019-12-29 NOTE — Telephone Encounter (Signed)
Patient called regarding surgery/request to cancel the procedure scheduled 01/21/20. States feeling better - has no more tingling. Please call at cell#[phone].

## 2020-01-20 ENCOUNTER — Other Ambulatory Visit (HOSPITAL_COMMUNITY): Payer: BC Managed Care – PPO

## 2020-01-20 ENCOUNTER — Encounter (HOSPITAL_COMMUNITY): Payer: BC Managed Care – PPO

## 2020-01-21 ENCOUNTER — Ambulatory Visit: Admit: 2020-01-21 | Payer: BC Managed Care – PPO | Admitting: Orthopedic Surgery

## 2020-01-21 SURGERY — CARPAL TUNNEL RELEASE
Anesthesia: Choice | Laterality: Bilateral

## 2020-01-28 ENCOUNTER — Ambulatory Visit: Payer: BC Managed Care – PPO | Admitting: Internal Medicine

## 2020-01-28 ENCOUNTER — Other Ambulatory Visit: Payer: Self-pay

## 2020-01-28 ENCOUNTER — Encounter: Payer: Self-pay | Admitting: Internal Medicine

## 2020-01-28 ENCOUNTER — Other Ambulatory Visit: Payer: Self-pay | Admitting: Obstetrics & Gynecology

## 2020-01-28 VITALS — BP 126/74 | HR 100 | Temp 97.2°F | Ht 61.0 in | Wt 226.4 lb

## 2020-01-28 DIAGNOSIS — R05 Cough: Secondary | ICD-10-CM

## 2020-01-28 DIAGNOSIS — R0600 Dyspnea, unspecified: Secondary | ICD-10-CM | POA: Diagnosis not present

## 2020-01-28 DIAGNOSIS — R0609 Other forms of dyspnea: Secondary | ICD-10-CM

## 2020-01-28 DIAGNOSIS — R058 Other specified cough: Secondary | ICD-10-CM

## 2020-01-28 MED ORDER — BUDESONIDE-FORMOTEROL FUMARATE 80-4.5 MCG/ACT IN AERO: INHALATION_SPRAY | RESPIRATORY_TRACT | 12 refills | Status: DC

## 2020-01-28 MED ORDER — METHYLPREDNISOLONE ACETATE 80 MG/ML IJ SUSP
120.0000 mg | Freq: Once | INTRAMUSCULAR | Status: AC
  Administered 2020-01-28: 120 mg via INTRAMUSCULAR

## 2020-01-28 MED ORDER — BENZONATATE 200 MG PO CAPS: 200.0000 mg | ORAL_CAPSULE | Freq: Three times a day (TID) | ORAL | 1 refills | Status: DC | PRN

## 2020-01-28 NOTE — Progress Notes (Signed)
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      [facility] dx  VCD around 2015  And by Lintzenich 05/31/2011 Pulmonary eval [facility] 05/31/2011 failed singualir / rec rx anxiety and gerd     History of Present Illness  07/08/2019  Pulmonary/ 1st office eval/Heather Mata  Chief Complaint  Patient presents with  . Pulmonary Consult    Former pt of Dr Sinda Du. Pt states has had Asthma all of her life. She c/o increased SOB, wheezing and cough worsening for the past year. She is using her albuterol inhaler 6-8 x per day and neb 8 x per day.   Dyspnea:  Steps ok p neb  Cough: harsh min mucoid with hoarseness corresponding to flare  Sleep: sev times per night needs neb /choaking/ urinary incont SABA use: way too much as above and anxious her saba hfa no longer working so over using neb now No obvious day to day or daytime variability or assoc excess/ purulent sputum or mucus plugs or hemoptysis or cp or   or overt sinus or hb symptoms on prilosec 40 mg qd  rec Plan A = Automatic = Always=   Stiolto 2 puffs each am  Prednisone 10 mg  2  Daily then one daily x 5 days and stop  Plan B = Backup (to supplement plan A, not to replace it) Only use your albuterol inhaler as a rescue medication  Plan C = Crisis (instead of Plan B but only if Plan B stops working) - only use your albuterol nebulizer if you first  try Plan B and it fails to help > ok to use the nebulizer up to every 4 hours but if start needing it regularly call for immediate appointment Prilosec (20 x 2) 40 mg Take 30- 60 min before your first and last meals of the day  GERD diet    08/07/2019  f/u ov/Heather Mata re: dtc asthma vs vcd - did not return with all meds in 2 bags Chief Complaint  Patient presents with  . Follow-up    Breathing is better and she is coughing less since the last visit. She is using her rescue inhaler 3 x per day and neb once per day once on average.   Dyspnea: not reproduced with ex but present at rest/ still having spells while on stiolto  Cough: better off breo 200 and on tessalon 100 x 3 in am and 2  In pm (not what was rec ) and prilosec 40 mg x 2 bid (80 bid ) ac Sleeping: 30 degrees with bed blocks  SABA use: not since 630 am (10 h prior to ov) taken 30 min after am stiolto  02: none  rec Beware that any form of progesterone can cause worse reflux  Omeprazole is 40 mg  Take 30- 60 min before your first and last meals of the day  Continue stiolto for now at 2 puff each am  For use your albuterol as a rescue medication  If not better or not tolerating albuterol spray, use the nebulized albuterol up to every 4 hours as needed For cough tessalon 200 (2 x100)  mg up to every 6 hours if needed    01/28/2020  f/u ov/ office/Heather Mata re:  Chief Complaint  Patient presents with  . Follow-up    shortness of breath with activity   Dyspnea:  Mostly when coughing which is also worse with ex Cough: dry raspy  Day > noct    No obvious day to day or daytime variability or assoc excess/ purulent sputum or mucus plugs or hemoptysis or cp or chest tightness, subjective wheeze or overt sinus or hb symptoms.     Also denies any obvious fluctuation of symptoms with weather or environmental changes or other aggravating or alleviating factors except as outlined above   No unusual exposure hx or h/o childhood pna/ asthma  or knowledge of premature birth.  Current Allergies, Complete Past Medical History, Past Surgical History, Family History, and Social History were reviewed in [employer] record.  ROS  The following are not active complaints unless bolded Hoarseness, sore throat, dysphagia, dental problems, itching, sneezing,  nasal congestion or discharge of excess mucus or purulent secretions, ear ache,   fever, chills, sweats, unintended wt loss or wt gain, classically pleuritic or exertional cp,  orthopnea pnd or arm/hand swelling  or leg swelling, presyncope, palpitations, abdominal pain, anorexia, nausea, vomiting, diarrhea  or change in bowel habits or change in bladder habits, change in stools or change in urine, dysuria, hematuria,  rash, arthralgias, visual complaints, headache, numbness, weakness or ataxia or problems with walking or coordination,  change in mood or  memory.          Current Meds  Medication Sig  . albuterol (VENTOLIN HFA) 108 (90 Base) MCG/ACT inhaler Inhale 2 puffs into the lungs every 4 (four) hours as needed.  . ALPRAZolam (XANAX) 1 MG tablet TAKE 1 TABLET (1 MG TOTAL) BY MOUTH 3 (THREE) TIMES DAILY AS NEEDED.  . clonazePAM (KLONOPIN) 1 MG tablet Take 1 mg by mouth at bedtime.  . cyclobenzaprine (FLEXERIL) 10 MG tablet Take 10 mg by mouth as needed for muscle spasms.   . DULoxetine (CYMBALTA) 20 MG capsule Take 40 mg by mouth daily.  Marland Kitchen ipratropium-albuterol (DUONEB) 0.5-2.5 (3) MG/3ML SOLN Take 3 mLs by nebulization every 6 (six) hours as needed (wheezing/shortness of breath).   . Misc. Devices MISC Handheld portable nebulizer machine  . omeprazole (PRILOSEC) 40 MG capsule Take 30- 60 min before your first and last meals of the day  . promethazine (PHENERGAN) 25 MG tablet Take 25 mg by mouth every 6 (six) hours as needed for nausea or vomiting.  Marland Kitchen UBRELVY 100 MG TABS Take 1 tablet by mouth daily as needed.  . [  benzonatate (TESSALON) 100 MG capsule Take by  mouth 3 (three) times daily as needed for cough.  .  iotropium Bromide-Olodaterol (STIOLTO RESPIMAT) 2.5-2.5 MCG/ACT AERS Inhale 2 puffs into the lungs daily.                Past Medical History:  Diagnosis Date  . Abdominal bloating associated with menstruation 04/08/2015  . Anxiety   . Asthma   . Cancer (HCC)    cervical dysplasia  . Cervical  dysplasia   . Chronic low back pain 01/27/2015  . GERD (gastroesophageal reflux disease)   . History of cervical dysplasia 04/08/2015   Had LEEP 2 x  . Hypertension   . Reflux    laryngotracheal   . 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 Vaginal Pap smear, abnormal        Objective:    Obese hoarse wf nad classic voice fatigue and prominent pseudowheeze              01/28/2020         226   08/07/19 220 lb 12.8 oz (100.2 kg)  07/21/19 218 lb (98.9 kg)  07/08/19 225 lb (102.1 kg)     Vital signs reviewed  01/28/2020  - Note at rest 02 sats  95% on RA     HEENT : pt wearing mask not removed for exam due to covid -19 concerns.    NECK :  without JVD/Nodes/TM/ nl carotid upstrokes bilaterally   LUNGS: no acc muscle use,  Nl contour chest which is clear to A and P bilaterally without cough on insp or exp maneuvers   CV:  RRR  no s3 or murmur or increase in P2, and no edema   ABD:  soft and nontender with nl inspiratory excursion in the supine position. No bruits or organomegaly appreciated, bowel sounds nl  MS:  Nl gait/ ext warm without deformities, calf tenderness, cyanosis or clubbing No obvious joint restrictions   SKIN: warm and dry without lesions    NEURO:  alert, approp, nl sensorium with  no motor or cerebellar deficits apparent.            Assessment

## 2020-01-28 NOTE — Patient Instructions (Addendum)
We will pull Dr Luan Pulling records for your PFTs  NO MINT PRODUCTS OF ANY KIND  Stop stiolto   depomedrol 120 mg IM today   Symbicort 80 Take 2 puffs first thing in am and then another 2 puffs about 12 hours later.    Use Spacer   For cough > tessilon 200 mg every 6-8  hours as needed    Plan A = Automatic = Always=    Symbicort 80   Plan B = Backup (to supplement plan A, not to replace it) Only use your albuterol inhaler as a rescue medication to be used if you can't catch your breath by resting or doing a relaxed purse lip breathing pattern.  - The less you use it, the better it will work when you need it. - Ok to use the inhaler up to 2 puffs  every 4 hours if you must but call for appointment if use goes up over your usual need - Don't leave home without it !!  (think of it like the spare tire for your car)   Plan C = Crisis (instead of Plan B but only if Plan B stops working) - only use your albuterol nebulizer if you first try Plan B and it fails to help > ok to use the nebulizer up to every 4 hours but if start needing it regularly call for immediate appointment    Please schedule a follow up office visit in 4 weeks, sooner if needed with all medications/ inhalers/ solutions/ device (not nebulizer)

## 2020-01-29 ENCOUNTER — Encounter: Payer: Self-pay | Admitting: Internal Medicine

## 2020-01-29 NOTE — Assessment & Plan Note (Addendum)
Dx of VCD by Benjamine Mola around 2015  - Allergy profile 12/17/2018  >  Eos 0.2 /  IgE  11 - d/c breo 200 07/08/2019 and started stiolto 2 each am so as not to aggravate VCD  - 08/07/2019  After extensive coaching inhaler device,  effectiveness =    75% with hfa and 90% with smi > continue stiolto 2 each am  - 01/28/2020 try symb 80 2bid p depomerol 120 mg IM   Although very difficult to tease out, it appears she has elements of both asthma and vcd and best test would be MCT but not available for now so try change stiolto to low dose sym via spacer.  Explained to pt: Of the three most common causes of  Sub-acute / recurrent or chronic cough, only one (GERD)  can actually contribute to/ trigger  the other two (asthma and post nasal drip syndrome)  and perpetuate the cylce of cough.  While not intuitively obvious, many patients with chronic low grade reflux do not cough until there is a primary insult that disturbs the protective epithelial barrier and exposes sensitive nerve endings.   This is typically viral but can due to PNDS and  either may apply here.   The point is that once this occurs, it is difficult to eliminate the cycle  using anything but a maximally effective acid suppression regimen at least in the short run, accompanied by an appropriate diet to address non acid GERD and control / eliminate the cough itself with tessalon which she has found effective in past.         Each maintenance medication was reviewed in detail including emphasizing most importantly the difference between maintenance and prns and under what circumstances the prns are to be triggered using an action plan format where appropriate.  Total time for H and P, chart review, counseling, teaching device/spacer technique  and generating customized AVS unique to this office visit / charting =36  min

## 2020-02-05 ENCOUNTER — Ambulatory Visit: Payer: BC Managed Care – PPO | Admitting: Orthopedic Surgery

## 2020-02-23 ENCOUNTER — Other Ambulatory Visit (HOSPITAL_COMMUNITY): Payer: Self-pay | Admitting: Adult Health

## 2020-02-23 DIAGNOSIS — Z1231 Encounter for screening mammogram for malignant neoplasm of breast: Secondary | ICD-10-CM

## 2020-03-18 ENCOUNTER — Ambulatory Visit: Payer: BC Managed Care – PPO | Admitting: Internal Medicine

## 2020-04-12 ENCOUNTER — Other Ambulatory Visit: Payer: Self-pay

## 2020-04-12 ENCOUNTER — Encounter: Payer: Self-pay | Admitting: Internal Medicine

## 2020-04-12 ENCOUNTER — Ambulatory Visit: Payer: BC Managed Care – PPO | Admitting: Internal Medicine

## 2020-04-12 VITALS — BP 110/72 | HR 109 | Temp 97.5°F | Ht 61.0 in | Wt 219.0 lb

## 2020-04-12 DIAGNOSIS — Z23 Encounter for immunization: Secondary | ICD-10-CM

## 2020-04-12 DIAGNOSIS — R058 Other specified cough: Secondary | ICD-10-CM

## 2020-04-12 MED ORDER — BENZONATATE 200 MG PO CAPS: 200.0000 mg | ORAL_CAPSULE | Freq: Three times a day (TID) | ORAL | 1 refills | Status: DC | PRN

## 2020-04-12 NOTE — Patient Instructions (Signed)
No change in medications    Please schedule a follow up visit in 6  months but call sooner if needed  

## 2020-04-12 NOTE — Progress Notes (Signed)
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      Heather Mata dx  VCD around 2015  And by Heather Mata 05/31/2011 Pulmonary eval Heather Mata 05/31/2011 failed singualir / rec rx anxiety and gerd     History of Present Illness  07/08/2019  Pulmonary/ 1st office eval/Heather Mata  Chief Complaint  Patient presents with  . Pulmonary Consult    Former pt of Dr Heather Mata. Pt states has had Asthma all of her life. She c/o increased SOB, wheezing and cough worsening for the past year. She is using her albuterol inhaler 6-8 x per day and neb 8 x per day.   Dyspnea:  Steps ok p neb  Cough: harsh min mucoid with hoarseness corresponding to flare  Sleep: sev times per night needs neb /choaking/ urinary incont SABA use: way too much as above and anxious her saba hfa no longer working so over using neb now No obvious day to day or daytime variability or assoc excess/ purulent sputum or mucus plugs or hemoptysis or cp or   or overt sinus or hb symptoms on prilosec 40 mg qd  rec Plan A = Automatic = Always=   Stiolto 2 puffs each am  Prednisone 10 mg  2  Daily then one daily x 5 days and stop  Plan B = Backup (to supplement plan A, not to replace it) Only use your albuterol inhaler as a rescue medication  Plan C = Crisis (instead of Plan B but only if Plan B stops working) - only use your albuterol nebulizer if you first  try Plan B and it fails to help > ok to use the nebulizer up to every 4 hours but if start needing it regularly call for immediate appointment Prilosec (20 x 2) 40 mg Take 30- 60 min before your first and last meals of the day  GERD diet    08/07/2019  f/u ov/Heather Mata re: dtc asthma vs vcd - did not return with all meds in 2 bags Chief Complaint  Patient presents with  . Follow-up    Breathing is better and she is coughing less since the last visit. She is using her rescue inhaler 3 x per day and neb once per day once on average.   Dyspnea: not reproduced with ex but present at rest/ still having spells while on stiolto  Cough: better off breo 200 and on tessalon 100 x 3 in am and 2  In pm (not what was rec ) and prilosec 40 mg x 2 bid (80 bid ) ac Sleeping: 30 degrees with bed blocks  SABA use: not since 630 am (10 h prior to ov) taken 30 min after am stiolto  02: none  rec Beware that any form of progesterone can cause worse reflux  Omeprazole is 40 mg  Take 30- 60 min before your first and last meals of the day  Continue stiolto for now at 2 puff each am  For use your albuterol as a rescue medication  If not better or not tolerating albuterol spray, use the nebulized albuterol up to every 4 hours as needed For cough tessalon 200 (2 x100)  mg up to every 6 hours if needed    01/28/2020  f/u ov/[place] office/Heather Mata re: dtca vs uacs  Chief Complaint  Patient presents with  . Follow-up    shortness of breath with activity   Dyspnea:  Mostly when coughing which is also worse with ex Cough: dry raspy  Day > noct  rec We will pull Dr Heather Mata records for your PFTs NO MINT PRODUCTS OF ANY KIND Stop stiolto  depomedrol 120 mg IM today  Symbicort 80 Take 2 puffs first thing in am and then another 2 puffs about 12 hours later.  Use Spacer  For cough > tessilon 200 mg every 6-8  hours as needed  Plan A = Automatic = Always=    Symbicort 80  Plan B = Backup (to supplement plan A, not to  replace it) Only use your albuterol inhaler as a rescue medication  Plan C = Crisis (instead of Plan B but only if Plan B stops working) - only use your albuterol nebulizer if you first try Plan B and it fails to help Please schedule a follow up office visit in 4 weeks, sooner if needed with all medications/ inhalers/ solutions/ device (not nebulizer)     04/12/2020  f/u ov/Heather Mata re: cough variant asthma symptoms resolved on symb 80 2bid via spacer Chief Complaint  Patient presents with  . Follow-up    Breathing is much improved with use of spacer device with symbicort. She is using her albuterol inhaler 2 x per wk on average and rarely uses neb.    Dyspnea: much improved "walking 10k steps a day, up steps too"  Cough: none  Sleeping: bed blocks SABA use: rarely  02: none    No obvious day to day or daytime variability or assoc excess/ purulent sputum or mucus plugs or hemoptysis or cp or chest tightness, subjective wheeze or overt sinus or hb symptoms.   Sleeping as above  without nocturnal  or early am exacerbation  of respiratory  c/o's or need for noct saba. Also denies any obvious fluctuation of symptoms with weather or environmental changes or other aggravating or alleviating factors except as outlined above   No unusual exposure hx or h/o childhood pna or knowledge of premature birth.  Current Allergies, Complete Past Medical History, Past Surgical History, Family History, and Social History were reviewed in [employer] record.  ROS  The following are not active complaints unless bolded Hoarseness, sore throat, dysphagia, dental problems, itching, sneezing,  nasal congestion or discharge of excess mucus or purulent secretions, ear ache,   fever, chills, sweats, unintended wt loss or wt gain, classically pleuritic or exertional cp,  orthopnea pnd or arm/hand swelling  or leg swelling, presyncope, palpitations, abdominal pain, anorexia, nausea, vomiting,  diarrhea  or change in bowel habits or change in bladder habits, change in stools or change in urine, dysuria, hematuria,  rash, arthralgias, visual complaints, headache, numbness, weakness or ataxia or problems with walking or coordination,  change in mood/ anxious or  memory.        Current Meds  Medication Sig  . albuterol (VENTOLIN HFA) 108 (90 Base)  MCG/ACT inhaler Inhale 2 puffs into the lungs every 4 (four) hours as needed.  . ALPRAZolam (XANAX) 1 MG tablet TAKE 1 TABLET (1 MG TOTAL) BY MOUTH 3 (THREE) TIMES DAILY AS NEEDED.  . benzonatate (TESSALON) 200 MG capsule Take 1 capsule (200 mg total) by mouth 3 (three) times daily as needed for cough.  . budesonide-formoterol (SYMBICORT) 80-4.5 MCG/ACT inhaler Take 2 puffs first thing in am and then another 2 puffs about 12 hours later.  . clonazePAM (KLONOPIN) 1 MG tablet Take 1 mg by mouth at bedtime.  . cyclobenzaprine (FLEXERIL) 10 MG tablet Take 10 mg by mouth as needed for muscle spasms.   . DULoxetine (CYMBALTA) 20 MG capsule Take 40 mg by mouth daily.  Marland Kitchen ipratropium-albuterol (DUONEB) 0.5-2.5 (3) MG/3ML SOLN Take 3 mLs by nebulization every 6 (six) hours as needed (wheezing/shortness of breath).   . Misc. Devices MISC Handheld portable nebulizer machine  . omeprazole (PRILOSEC) 40 MG capsule Take 30- 60 min before your first and last meals of the day  . promethazine (PHENERGAN) 25 MG tablet Take 25 mg by mouth every 6 (six) hours as needed for nausea or vomiting.  . rosuvastatin (CRESTOR) 5 MG tablet Take 5 mg by mouth daily.  Marland Kitchen UBRELVY 100 MG TABS Take 1 tablet by mouth daily as needed.                  Past Medical History:  Diagnosis Date  . Abdominal bloating associated with menstruation 04/08/2015  . Anxiety   . Asthma   . Cancer (HCC)    cervical dysplasia  . Cervical dysplasia   . Chronic low back pain 01/27/2015  . GERD (gastroesophageal reflux disease)   . History of cervical dysplasia 04/08/2015   Had LEEP 2 x   . Hypertension   . Reflux    laryngotracheal   . 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 Vaginal Pap smear, abnormal        Objective:      04/12/2020      219            01/28/2020         226   08/07/19 220 lb 12.8 oz (100.2 kg)  07/21/19 218 lb (98.9 kg)  07/08/19 225 lb (102.1 kg)     Vital signs reviewed  04/12/2020  - Note at rest 02 sats  97% on RA        pleasant amb wf nad nad   HEENT : pt wearing mask not removed for exam due to covid -19 concerns.    NECK :  without JVD/Nodes/TM/ nl carotid upstrokes bilaterally   LUNGS: no acc muscle use,  Nl contour chest which is clear to A and P bilaterally without cough on insp or exp maneuvers   CV:  RRR  no s3 or murmur or increase in P2, and no edema   ABD:  soft and nontender with nl inspiratory excursion in the supine position. No bruits or organomegaly appreciated, bowel sounds nl  MS:  Nl gait/ ext warm without deformities, calf tenderness, cyanosis or clubbing No obvious joint restrictions   SKIN: warm and dry without lesions    NEURO:  alert, approp, nl sensorium with  no motor or cerebellar deficits apparent.        Assessment

## 2020-04-12 NOTE — Assessment & Plan Note (Signed)
Dx of VCD by Heather Mata around 2015  - Allergy profile 12/17/2018  >  Eos 0.2 /  IgE  11 - d/c breo 200 07/08/2019 and started stiolto 2 each am so as not to aggravate VCD  - 08/07/2019  After extensive coaching inhaler device,  effectiveness =    75% with hfa and 90% with smi > continue stiolto 2 each am  - 01/28/2020 try symb 80 2bid via spacer p depomerol 120 mg IM on max gerd rx > symptoms and need for saba resolved as of f/u 04/12/2020 > no change rx   All goals of chronic asthma control met including optimal function and elimination of symptoms with minimal need for rescue therapy.  Contingencies discussed in full including contacting this office immediately if not controlling the symptoms using the rule of two's.            Each maintenance medication was reviewed in detail including emphasizing most importantly the difference between maintenance and prns and under what circumstances the prns are to be triggered using an action plan format where appropriate.  Total time for H and P, chart review, counseling, reviewing  devices and generating customized AVS unique to this office visit / charting  31 min

## 2020-04-13 ENCOUNTER — Ambulatory Visit (HOSPITAL_COMMUNITY)
Admission: RE | Admit: 2020-04-13 | Discharge: 2020-04-13 | Disposition: A | Discharge status: Home / Self Care | Payer: BC Managed Care – PPO | Source: Ambulatory Visit | Attending: Adult Health | Admitting: Adult Health

## 2020-04-13 DIAGNOSIS — Z1231 Encounter for screening mammogram for malignant neoplasm of breast: Secondary | ICD-10-CM | POA: Diagnosis present

## 2020-04-18 ENCOUNTER — Other Ambulatory Visit (HOSPITAL_COMMUNITY): Payer: Self-pay | Admitting: Adult Health

## 2020-04-18 DIAGNOSIS — R928 Other abnormal and inconclusive findings on diagnostic imaging of breast: Secondary | ICD-10-CM

## 2020-04-19 ENCOUNTER — Other Ambulatory Visit: Payer: Self-pay

## 2020-04-19 ENCOUNTER — Ambulatory Visit (HOSPITAL_COMMUNITY)
Admission: RE | Admit: 2020-04-19 | Discharge: 2020-04-19 | Disposition: A | Discharge status: Home / Self Care | Payer: BC Managed Care – PPO | Source: Ambulatory Visit | Attending: Adult Health | Admitting: Adult Health

## 2020-04-19 DIAGNOSIS — R928 Other abnormal and inconclusive findings on diagnostic imaging of breast: Secondary | ICD-10-CM

## 2020-06-08 ENCOUNTER — Other Ambulatory Visit: Payer: Self-pay | Admitting: Obstetrics & Gynecology

## 2020-07-04 ENCOUNTER — Other Ambulatory Visit: Payer: Self-pay | Admitting: Internal Medicine

## 2020-07-04 MED ORDER — BENZONATATE 200 MG PO CAPS
200.0000 mg | ORAL_CAPSULE | Freq: Three times a day (TID) | ORAL | 1 refills | Status: DC | PRN
Start: 2020-07-04 — End: 2020-10-04

## 2020-08-08 ENCOUNTER — Other Ambulatory Visit: Payer: Self-pay

## 2020-08-08 ENCOUNTER — Ambulatory Visit (INDEPENDENT_AMBULATORY_CARE_PROVIDER_SITE_OTHER): Payer: BC Managed Care – PPO | Admitting: Orthopedic Surgery

## 2020-08-08 VITALS — BP 149/91 | HR 115 | Ht 61.0 in | Wt 234.0 lb

## 2020-08-08 DIAGNOSIS — R200 Anesthesia of skin: Secondary | ICD-10-CM

## 2020-08-08 DIAGNOSIS — G5603 Carpal tunnel syndrome, bilateral upper limbs: Secondary | ICD-10-CM

## 2020-08-08 MED ORDER — MELOXICAM 7.5 MG PO TABS: 7.5000 mg | ORAL_TABLET | Freq: Every day | ORAL | 5 refills | Status: DC

## 2020-08-08 NOTE — Progress Notes (Addendum)
Chief Complaint  Patient presents with  . Carpal Tunnel    Bilateral wants injections again    H/o carpal tunnel syndrome bilateral  Considered surgery but injections helped before   She wants to try them again   Carpal tunnel injection right wrist  Celestone 6 mg and lidocaine 1%   Standard approach radial border of ring finger wrist brace injected carpal tunnel doing well repeated left side carpal tunnel injection same exact procedure

## 2020-08-08 NOTE — Patient Instructions (Signed)
Call us for preop for surgery

## 2020-08-25 ENCOUNTER — Other Ambulatory Visit: Payer: Self-pay

## 2020-08-25 ENCOUNTER — Other Ambulatory Visit (HOSPITAL_COMMUNITY)
Admission: RE | Admit: 2020-08-25 | Discharge: 2020-08-25 | Disposition: A | Discharge status: Home / Self Care | Payer: BC Managed Care – PPO | Source: Ambulatory Visit | Attending: Adult Health | Admitting: Adult Health

## 2020-08-25 ENCOUNTER — Ambulatory Visit (INDEPENDENT_AMBULATORY_CARE_PROVIDER_SITE_OTHER): Payer: BC Managed Care – PPO | Admitting: Adult Health

## 2020-08-25 ENCOUNTER — Encounter: Payer: Self-pay | Admitting: Adult Health

## 2020-08-25 VITALS — BP 125/77 | HR 106 | Ht 61.0 in | Wt 235.0 lb

## 2020-08-25 DIAGNOSIS — Z01419 Encounter for gynecological examination (general) (routine) without abnormal findings: Secondary | ICD-10-CM | POA: Insufficient documentation

## 2020-08-25 DIAGNOSIS — Z1211 Encounter for screening for malignant neoplasm of colon: Secondary | ICD-10-CM

## 2020-08-25 LAB — HEMOCCULT GUIAC POC 1CARD (OFFICE): Fecal Occult Blood, POC: NEGATIVE

## 2020-08-25 NOTE — Progress Notes (Signed)
Patient ID: Heather Mata, female   DOB: 1975-06-09, 45 y.o.   MRN: [id number] History of Present Illness: Heather Mata is a 45 year old white female,single, [id number] in for a well woman gyn exam and she wants a pap. PCP is Dr Nevada Crane.    Current Medications, Allergies, Past Medical History, Past Surgical History, Family History and Social History were reviewed in [employer] record.     Review of Systems: Patient denies any daily headaches(on meds for migraines), hearing loss, fatigue, blurred vision, shortness of breath, chest pain, abdominal pain, problems with bowel movements, urination, or intercourse(not active). No joint pain or mood swings. Has gained weight    Physical Exam:BP 125/77 (BP Location: Left Arm, Patient Position: Sitting, Cuff Size: Normal)   Pulse (!) 106   Ht 5\' 1"  (1.549 m)   Wt 235 lb (106.6 kg)   LMP 08/15/2020   BMI 44.40 kg/m  General:  Well developed, well nourished, no acute distress Skin:  Warm and dry Neck:  Midline trachea, normal thyroid, good ROM, no lymphadenopathy Lungs; Clear to auscultation bilaterally Breast:  No dominant palpable mass, retraction, or nipple discharge Cardiovascular: Regular rate and rhythm Abdomen:  Soft, non tender, no hepatosplenomegaly Pelvic:  External genitalia is normal in appearance, no lesions.  The vagina is normal in appearance. Urethra has no lesions or masses. The cervix is bulbous.Pap with GC/CHL and HR HPV genotyping performed.  Uterus is felt to be normal size, shape, and contour.  No adnexal masses or tenderness noted.Bladder is non tender, no masses felt. Rectal: Good sphincter tone, no polyps, or hemorrhoids felt.  Hemoccult negative. Extremities/musculoskeletal:  No swelling or varicosities noted, no clubbing or cyanosis Psych:  No mood changes, alert and cooperative,seems happy AA is 2 Fall risk is low PHQ 9 score is 0 GAD 7 score is 4  Upstream - 08/25/20 1458      Pregnancy Intention  Screening   Does the patient want to become pregnant in the next year? No    Does the patient's partner want to become pregnant in the next year? No    Would the patient like to discuss contraceptive options today? No      Contraception Wrap Up   Current Method Abstinence    End Method Abstinence    Contraception Counseling Provided No         Examination chaperoned by Celene Squibb LPN  Impression and Plan: 1. Encounter for gynecological examination with Papanicolaou smear of cervix Pap sent Physical in 1 year  Pap in 3 if normal Mammogram yearly Labs with PCP Colonoscopy at 46   2. Encounter for screening fecal occult blood testing

## 2020-08-30 ENCOUNTER — Other Ambulatory Visit: Payer: Self-pay | Admitting: Adult Health

## 2020-08-30 MED ORDER — DOXYCYCLINE HYCLATE 100 MG PO TABS: 100.0000 mg | ORAL_TABLET | Freq: Two times a day (BID) | ORAL | 0 refills | Status: DC

## 2020-08-30 NOTE — Progress Notes (Signed)
rx doxycycline for URI/sinus

## 2020-09-05 ENCOUNTER — Encounter: Payer: Self-pay | Admitting: Adult Health

## 2020-09-05 DIAGNOSIS — R87810 Cervical high risk human papillomavirus (HPV) DNA test positive: Secondary | ICD-10-CM | POA: Insufficient documentation

## 2020-09-05 HISTORY — DX: Cervical high risk human papillomavirus (HPV) DNA test positive: R87.810

## 2020-09-05 LAB — CYTOLOGY - PAP
Chlamydia: NEGATIVE
Comment: NEGATIVE
Comment: NEGATIVE
Comment: NEGATIVE
Comment: NORMAL
Diagnosis: NEGATIVE
HPV 16: NEGATIVE
HPV 18 / 45: NEGATIVE
High risk HPV: POSITIVE — AB
Neisseria Gonorrhea: NEGATIVE

## 2020-10-04 ENCOUNTER — Ambulatory Visit
Admission: EM | Admit: 2020-10-04 | Discharge: 2020-10-04 | Disposition: A | Discharge status: Home / Self Care | Payer: BC Managed Care – PPO | Attending: Family Medicine | Admitting: Family Medicine

## 2020-10-04 ENCOUNTER — Encounter: Payer: Self-pay | Admitting: Emergency Medicine

## 2020-10-04 ENCOUNTER — Other Ambulatory Visit: Payer: Self-pay

## 2020-10-04 DIAGNOSIS — R059 Cough, unspecified: Secondary | ICD-10-CM

## 2020-10-04 DIAGNOSIS — R0602 Shortness of breath: Secondary | ICD-10-CM

## 2020-10-04 DIAGNOSIS — B349 Viral infection, unspecified: Secondary | ICD-10-CM | POA: Diagnosis not present

## 2020-10-04 DIAGNOSIS — Z20822 Contact with and (suspected) exposure to covid-19: Secondary | ICD-10-CM

## 2020-10-04 MED ORDER — BENZONATATE 100 MG PO CAPS: 100.0000 mg | ORAL_CAPSULE | Freq: Three times a day (TID) | ORAL | 0 refills | Status: DC

## 2020-10-04 MED ORDER — METHYLPREDNISOLONE SODIUM SUCC 125 MG IJ SOLR
125.0000 mg | Freq: Once | INTRAMUSCULAR | Status: AC
  Administered 2020-10-04: 125 mg via INTRAMUSCULAR

## 2020-10-04 NOTE — Discharge Instructions (Addendum)
You have received a steroid injection in the office today  I have sent in [place] for you to use one capsule every 8 hours as needed for cough.  Your COVID and Influenza tests are pending.  You should self quarantine until the test results are back.    Take Tylenol or ibuprofen as needed for fever or discomfort.  Rest and keep yourself hydrated.    Follow-up with your primary care provider if your symptoms are not improving.

## 2020-10-04 NOTE — ED Provider Notes (Signed)
[facility]    CSN: [id number] Arrival date & time: 10/04/20  0913      History   Chief Complaint No chief complaint on file.   HPI Heather Mata is a 45 y.o. female.   Reports headache, rhinorrhea, cough, SOB, body aches, fatigue for the last 4 days. Has been takign zyrtec with little relief. Reports positive Covid exposure at work. Has hx asthma. Has negative hx Covid. Has completed Covid vaccines. Has completed flu vaccines. Denies chills, sweats, rash, abdominal pain, fever, other symptoms.  ROS per HPI   The history is provided by the patient.    Past Medical History:  Diagnosis Date  . Abdominal bloating associated with menstruation 04/08/2015  . Anxiety   . Asthma   . Cancer (HCC)    cervical dysplasia  . Cervical dysplasia   . Chronic low back pain 01/27/2015  . GERD (gastroesophageal reflux disease)   . History of cervical dysplasia 04/08/2015   Had LEEP 2 x  . Hypertension   . Papanicolaou smear of cervix with positive high risk human papilloma virus (HPV) test 09/05/2020   08/2020 pap negative but +HPV other,  repeat pap in 1 year per ASCCP guidelines, 5 year CIN 3 +risk is 2.25%  . Reflux    laryngotracheal   . 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 Vaginal Pap smear, abnormal     Patient Active Problem List   Diagnosis Date Noted  . Papanicolaou smear of cervix with positive high risk human papilloma virus (HPV) test 09/05/2020  . Encounter for screening fecal occult blood testing 08/25/2020  . Hot flashes 07/21/2019  . Screening for colorectal cancer 07/21/2019  . Encounter for gynecological examination with Papanicolaou smear of cervix 07/21/2019  . DOE (dyspnea on exertion) 07/08/2019  . Upper airway cough syndrome vs cough variant asthma 07/08/2019  . Morbid obesity due to excess calories ([place]) 07/08/2019  . RLQ abdominal pain 09/11/2016  . Weight gain 09/11/2016  . 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 History of cervical dysplasia 04/08/2015  . Bloating 04/08/2015  . Chronic low back pain 01/27/2015  . Unspecified asthma(493.90) 02/02/2014  . GERD (gastroesophageal reflux disease) 02/02/2014  . Asthma 02/05/2013  . Anxiety 05/31/2011  . History of cholecystectomy 05/31/2011  . Noninflammatory disorder of cervix 05/31/2011    Past Surgical History:  Procedure Laterality Date  . BACK SURGERY     ablation  . CHOLECYSTECTOMY     age 43, non functioning  . ESOPHAGOGASTRODUODENOSCOPY N/A 06/17/2014   Procedure: ESOPHAGOGASTRODUODENOSCOPY (EGD);  Surgeon: Rogene Houston, MD;  Location: [facility];  Service: Endoscopy;  Laterality: N/A;  830 - moved to 1/28 @ 9:30 - Ann notified pt  . LEEP     x 2    OB History    Gravida  4   Para  1   Term  1   Preterm      AB  3   Living  1     SAB  1   IAB  2   Ectopic      Multiple      Live Births  1            Home Medications    Prior to Admission medications   Medication Sig Start Date End Date Taking? Authorizing Provider  benzonatate (TESSALON) 100 MG capsule Take 1 capsule (100 mg total) by mouth every 8 (eight) hours. 10/04/20  Yes Zigmund Daniel,  Colletta Maryland, NP  doxycycline (VIBRA-TABS) 100 MG tablet Take 1 tablet (100 mg total) by mouth 2 (two) times daily. 08/30/20   Estill Dooms, NP  albuterol (VENTOLIN HFA) 108 (90 Base) MCG/ACT inhaler Inhale 2 puffs into the lungs every 4 (four) hours as needed. 07/01/19   [provider]  ALPRAZolam (XANAX) 1 MG tablet TAKE 1 TABLET (1 MG TOTAL) BY MOUTH 3 (THREE) TIMES DAILY AS NEEDED. 06/08/20   Florian Buff, MD  budesonide-formoterol (SYMBICORT) 80-4.5 MCG/ACT inhaler Take 2 puffs first thing in am and then another 2 puffs about 12 hours later. 01/28/20   Tanda Rockers, MD  clonazePAM (KLONOPIN) 1 MG tablet Take 1 mg by mouth at bedtime.    [provider]  cyclobenzaprine (FLEXERIL) 10 MG tablet Take 10 mg by mouth as needed for muscle spasms.   01/05/13   [provider]  DULoxetine (CYMBALTA) 20 MG capsule Take 40 mg by mouth daily.    [provider]  ipratropium-albuterol (DUONEB) 0.5-2.5 (3) MG/3ML SOLN Take 3 mLs by nebulization every 6 (six) hours as needed (wheezing/shortness of breath).     [provider]  meloxicam (MOBIC) 7.5 MG tablet Take 1 tablet (7.5 mg total) by mouth daily. 08/08/20   Carole Civil, MD  Misc. Devices MISC Handheld portable nebulizer machine 07/08/19   Tanda Rockers, MD  omeprazole (PRILOSEC) 40 MG capsule Take 30- 60 min before your first and last meals of the day 08/07/19   Tanda Rockers, MD  promethazine (PHENERGAN) 25 MG tablet Take 25 mg by mouth every 6 (six) hours as needed for nausea or vomiting.    [provider]  rosuvastatin (CRESTOR) 5 MG tablet Take 5 mg by mouth daily. 02/25/20   [provider]  UBRELVY 100 MG TABS Take 1 tablet by mouth daily as needed. 11/20/19   [provider]  zolpidem (AMBIEN) 10 MG tablet Take 10 mg by mouth at bedtime. 08/01/20   [provider]    Family History Family History  Problem Relation Age of Onset  . Cancer Father        kidney  . Cancer Paternal Grandmother        brain  . Aneurysm Paternal Grandfather     Social History Social History   Tobacco Use  . Smoking status: Never Smoker  . Smokeless tobacco: Never Used  Vaping Use  . Vaping Use: Never used  Substance Use Topics  . Alcohol use: Yes    Comment: occasionally rare  . Drug use: No     Allergies   Tramadol, Codeine, and Penicillins   Review of Systems Review of Systems   Physical Exam Triage Vital Signs ED Triage Vitals  Enc Vitals Group     BP 10/04/20 0925 120/87     Pulse Rate 10/04/20 0925 (!) 115     Resp 10/04/20 0925 18     Temp 10/04/20 0925 99 F (37.2 C)     Temp Source 10/04/20 0925 Oral     SpO2 10/04/20 0925 93 %     Weight --      Height 10/04/20 0925 5\' 1"  (1.549 m)     Head  Circumference --      Peak Flow --      Pain Score 10/04/20 0934 0     Pain Loc --      Pain Edu? --      Excl. in [place]? --  No data found.  Updated Vital Signs BP 120/87 (BP Location: Right Arm)   Pulse (!) 115   Temp 99 F (37.2 C) (Oral)   Resp 18   Ht 5\' 1"  (1.549 m)   SpO2 93%   BMI 44.40 kg/m      Physical Exam Vitals and nursing note reviewed.  Constitutional:      General: She is not in acute distress.    Appearance: She is well-developed. She is ill-appearing.  HENT:     Head: Normocephalic and atraumatic.     Right Ear: Tympanic membrane, ear canal and external ear normal.     Left Ear: Tympanic membrane, ear canal and external ear normal.     Nose: Rhinorrhea present.     Mouth/Throat:     Mouth: Mucous membranes are moist.     Pharynx: Posterior oropharyngeal erythema present.  Eyes:     Extraocular Movements: Extraocular movements intact.     Conjunctiva/sclera: Conjunctivae normal.     Pupils: Pupils are equal, round, and reactive to light.  Cardiovascular:     Rate and Rhythm: Normal rate and regular rhythm.     Heart sounds: Normal heart sounds. No murmur heard.   Pulmonary:     Effort: Pulmonary effort is normal. No respiratory distress.     Breath sounds: Normal breath sounds. No stridor. No wheezing, rhonchi or rales.  Chest:     Chest wall: No tenderness.  Abdominal:     General: Bowel sounds are normal.     Palpations: Abdomen is soft.     Tenderness: There is no abdominal tenderness.  Musculoskeletal:        General: Normal range of motion.     Cervical back: Normal range of motion and neck supple.  Lymphadenopathy:     Cervical: Cervical adenopathy present.  Skin:    General: Skin is warm and dry.     Capillary Refill: Capillary refill takes less than 2 seconds.  Neurological:     General: No focal deficit present.     Mental Status: She is oriented to person, place, and time.  Psychiatric:        Mood and Affect: Mood normal.         Behavior: Behavior normal.        Thought Content: Thought content normal.      UC Treatments / Results  Labs (all labs ordered are listed, but only abnormal results are displayed) Labs Reviewed  COVID-19, FLU A+B NAA    EKG   Radiology No results found.  Procedures Procedures (including critical care time)  Medications Ordered in UC Medications  methylPREDNISolone sodium succinate (SOLU-MEDROL) 125 mg/2 mL injection 125 mg (125 mg Intramuscular Given 10/04/20 0955)    Initial Impression / Assessment and Plan / UC Course  I have reviewed the triage vital signs and the nursing notes.  Pertinent labs & imaging results that were available during my care of the patient were reviewed by me and considered in my medical decision making (see chart for details).    Viral Illness Covid exposure Cough SOB  Solumedrol 125mg  IM in office today Prescribed tessalon perles for cough prn Continue home medication regimen Work note provided Covid and flu swab obtained in office today.   Patient instructed to quarantine until results are back and negative.   If results are negative, patient may resume daily schedule as tolerated once they are fever free for 24 hours without the use of antipyretic medications.  If results are positive, patient instructed to quarantine for at least 5 days from symptom onset.  If after 5 days symptoms have resolved, may return to work with a well fitting mask for the next 5 days. If symptomatic after day 5, isolation should be extended to 10 days. Patient instructed to follow-up with primary care or with this office as needed.   Patient instructed to follow-up in the ER for trouble swallowing, trouble breathing, other concerning symptoms.   Final Clinical Impressions(s) / UC Diagnoses   Final diagnoses:  Viral illness  Cough  SOB (shortness of breath)  Close exposure to COVID-19 virus     Discharge Instructions     You have received a  steroid injection in the office today  I have sent in [place] for you to use one capsule every 8 hours as needed for cough.  Your COVID and Influenza tests are pending.  You should self quarantine until the test results are back.    Take Tylenol or ibuprofen as needed for fever or discomfort.  Rest and keep yourself hydrated.    Follow-up with your primary care provider if your symptoms are not improving.        ED Prescriptions    Medication Sig Dispense Auth. Provider   benzonatate (TESSALON) 100 MG capsule Take 1 capsule (100 mg total) by mouth every 8 (eight) hours. 21 capsule Faustino Congress, NP     PDMP not reviewed this encounter.   Faustino Congress, NP 10/04/20 1150

## 2020-10-04 NOTE — ED Triage Notes (Signed)
Pt states headache x 4 days, runny nose and cough, pt has asthma and trouble breathing, body aches.

## 2020-10-05 LAB — COVID-19, FLU A+B NAA
Influenza A, NAA: NOT DETECTED
Influenza B, NAA: NOT DETECTED
SARS-CoV-2, NAA: NOT DETECTED

## 2020-10-14 ENCOUNTER — Ambulatory Visit: Payer: BC Managed Care – PPO | Admitting: Internal Medicine

## 2020-10-14 ENCOUNTER — Encounter: Payer: Self-pay | Admitting: Internal Medicine

## 2020-10-14 ENCOUNTER — Ambulatory Visit (INDEPENDENT_AMBULATORY_CARE_PROVIDER_SITE_OTHER): Payer: BC Managed Care – PPO

## 2020-10-14 ENCOUNTER — Other Ambulatory Visit: Payer: Self-pay | Admitting: Obstetrics & Gynecology

## 2020-10-14 ENCOUNTER — Other Ambulatory Visit: Payer: Self-pay

## 2020-10-14 DIAGNOSIS — R058 Other specified cough: Secondary | ICD-10-CM

## 2020-10-14 MED ORDER — METHYLPREDNISOLONE ACETATE 80 MG/ML IJ SUSP
120.0000 mg | Freq: Once | INTRAMUSCULAR | Status: AC
Start: 2020-10-14 — End: 2020-10-14
  Administered 2020-10-14: 120 mg via INTRAMUSCULAR

## 2020-10-14 MED ORDER — BENZONATATE 200 MG PO CAPS
200.0000 mg | ORAL_CAPSULE | Freq: Three times a day (TID) | ORAL | 1 refills | Status: DC | PRN
Start: 2020-10-14 — End: 2021-05-19

## 2020-10-14 MED ORDER — CEFDINIR 300 MG PO CAPS: 300.0000 mg | ORAL_CAPSULE | Freq: Two times a day (BID) | ORAL | 0 refills | Status: DC

## 2020-10-14 NOTE — Progress Notes (Addendum)
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      Teoh dx  VCD around 2015  And by Heather Mata 05/31/2011 Pulmonary eval Heather Mata 05/31/2011 failed singualir / rec rx anxiety and gerd     History of Present Illness  07/08/2019  Pulmonary/ 1st Mata eval/Heather Mata  Chief Complaint  Patient presents with  . Pulmonary Consult    Former pt of Dr Heather Mata. Pt states has had Asthma all of her life. She c/o increased SOB, wheezing and cough worsening for the past year. She is using her albuterol inhaler 6-8 x per day and neb 8 x per day.   Dyspnea:  Steps ok p neb  Cough: harsh min mucoid with hoarseness corresponding to flare  Sleep: sev times per night needs neb /choaking/ urinary incont SABA use: way too much as above and anxious her saba hfa no longer working so over using neb now No obvious day to day or daytime variability or assoc excess/ purulent sputum or mucus plugs or hemoptysis or cp or   or overt sinus or hb symptoms on prilosec 40 mg qd  rec Plan A = Automatic = Always=   Stiolto 2 puffs each am  Prednisone 10 mg  2  Daily then one daily x 5 days and stop  Plan B = Backup (to supplement plan A, not to replace it) Only use your albuterol inhaler as a rescue medication  Plan C = Crisis (instead of Plan B but only if Plan B stops working) - only use your albuterol nebulizer if you first  try Plan B and it fails to help > ok to use the nebulizer up to every 4 hours but if start needing it regularly call for immediate appointment Prilosec (20 x 2) 40 mg Take 30- 60 min before your first and last meals of the day  GERD diet    08/07/2019  f/u ov/Heather Mata re: dtc asthma vs vcd - did not return with all meds in 2 bags Chief Complaint  Patient presents with  . Follow-up    Breathing is better and she is coughing less since the last visit. She is using her rescue inhaler 3 x per day and neb once per day once on average.   Dyspnea: not reproduced with ex but present at rest/ still having spells while on stiolto  Cough: better off breo 200 and on tessalon 100 x 3 in am and 2  In pm (not what was rec ) and prilosec 40 mg x 2 bid (80 bid ) ac Sleeping: 30 degrees with bed blocks  SABA use: not since 630 am (10 h prior to ov) taken 30 min after am stiolto  02: none  rec Beware that any form of progesterone can cause worse reflux  Omeprazole is 40 mg  Take 30- 60 min before your first and last meals of the day  Continue stiolto for now at 2 puff each am  For use your albuterol as a rescue medication  If not better or not tolerating albuterol spray, use the nebulized albuterol up to every 4 hours as needed For cough tessalon 200 (2 x100)  mg up to every 6 hours if needed    01/28/2020  f/u ov/Heather Mata/Heather Mata re: dtca vs uacs  Chief Complaint  Patient presents with  . Follow-up    shortness of breath with activity   Dyspnea:  Mostly when coughing which is also worse with ex Cough: dry raspy  Day > noct  rec We will pull Dr Heather Mata records for your PFTs NO MINT PRODUCTS OF ANY KIND Stop stiolto  depomedrol 120 mg IM today  Symbicort 80 Take 2 puffs first thing in am and then another 2 puffs about 12 hours later.  Use Spacer  For cough > tessilon 200 mg every 6-8  hours as needed  Plan A = Automatic = Always=    Symbicort 80  Plan B = Backup (to supplement plan A, not to  replace it) Only use your albuterol inhaler as a rescue medication  Plan C = Crisis (instead of Plan B but only if Plan B stops working) - only use your albuterol nebulizer if you first try Plan B and it fails to help Please schedule a follow up Mata visit in 4 weeks, sooner if needed with all medications/ inhalers/ solutions/ device (not nebulizer)     04/12/2020  f/u ov/Heather Mata re: cough variant asthma symptoms resolved on symb 80 2bid via spacer Chief Complaint  Patient presents with  . Follow-up    Breathing is much improved with use of spacer device with symbicort. She is using her albuterol inhaler 2 x per wk on average and rarely uses neb.    Dyspnea: much improved "walking 10k steps a day, up steps too"  Cough: none  Sleeping: bed blocks SABA use: rarely  02: none rec No change   10/14/2020  Acute ov/Heather Mata re: cough variant asthma/ maint on symb 80 2 bid and prevacid bid but not ac  Chief Complaint  Patient presents with  . Acute Visit    Increased cough x 2 wks. Voice is hoarse. She is coughing up green/yellow sputum. Also some wheezing and increased SOB. She is using her albuterol inhaler 3-4 x per wk and neb every morning.   Dyspnea:  With coughing Cough: green no better from doxy  / assoc nasal congestion flared x 2 weeks  Sleeping: bed blocks  SABA use: saba hfa and neb "no better"  02: none Covid status:   3 vaccinations   No obvious day to day or daytime variability or assoc   mucus plugs or hemoptysis or cp or chest tightness,  or   hb symptoms.    Also denies any obvious fluctuation of symptoms with weather or environmental changes or other aggravating or alleviating factors except as outlined above   No unusual exposure hx or h/o childhood pna/ asthma or knowledge of premature birth.  Current Allergies, Complete Past Medical History, Past Surgical History, Family History, and Social History were reviewed in [employer] record.  ROS  The  following are not active complaints unless bolded Hoarseness, sore throat, dysphagia, dental problems, itching, sneezing,  nasal congestion or discharge of excess mucus or purulent secretions, ear ache,   fever, chills, sweats, unintended  wt loss or wt gain, classically pleuritic or exertional cp,  orthopnea pnd or arm/hand swelling  or leg swelling, presyncope, palpitations, abdominal pain, anorexia, nausea, vomiting, diarrhea  or change in bowel habits or change in bladder habits, change in stools or change in urine, dysuria, hematuria,  rash, arthralgias, visual complaints, headache, numbness, weakness or ataxia or problems with walking or coordination,  change in mood or  memory.        Current Meds  Medication Sig  . albuterol (VENTOLIN HFA) 108 (90 Base) MCG/ACT inhaler Inhale 2 puffs into the lungs every 4 (four) hours as needed.  . ALPRAZolam (XANAX) 1 MG tablet TAKE 1 TABLET (1 MG TOTAL) BY MOUTH 3 (THREE) TIMES DAILY AS NEEDED.  . benzonatate (TESSALON) 100 MG capsule Take 1 capsule (100 mg total) by mouth every 8 (eight) hours.  . budesonide-formoterol (SYMBICORT) 80-4.5 MCG/ACT inhaler Take 2 puffs first thing in am and then another 2 puffs about 12 hours later.  . clonazePAM (KLONOPIN) 1 MG tablet Take 1 mg by mouth at bedtime.  . cyclobenzaprine (FLEXERIL) 10 MG tablet Take 10 mg by mouth as needed for muscle spasms.   . DULoxetine (CYMBALTA) 20 MG capsule Take 40 mg by mouth daily.  Marland Kitchen ipratropium-albuterol (DUONEB) 0.5-2.5 (3) MG/3ML SOLN Take 3 mLs by nebulization every 6 (six) hours as needed (wheezing/shortness of breath).   . meloxicam (MOBIC) 7.5 MG tablet Take 1 tablet (7.5 mg total) by mouth daily.  . Misc. Devices MISC Handheld portable nebulizer machine  . omeprazole (PRILOSEC) 40 MG capsule Take 30- 60 min before your first and last meals of the day  . promethazine (PHENERGAN) 25 MG tablet Take 25 mg by mouth every 6 (six) hours as needed for nausea or vomiting.  .  rosuvastatin (CRESTOR) 5 MG tablet Take 5 mg by mouth daily.  Marland Kitchen UBRELVY 100 MG TABS Take 1 tablet by mouth daily as needed.  . zolpidem (AMBIEN) 10 MG tablet Take 10 mg by mouth at bedtime.                  Past Medical History:  Diagnosis Date  . Abdominal bloating associated with menstruation 04/08/2015  . Anxiety   . Asthma   . Cancer (HCC)    cervical dysplasia  . Cervical dysplasia   . Chronic low back pain 01/27/2015  . GERD (gastroesophageal reflux disease)   . History of cervical dysplasia 04/08/2015   Had LEEP 2 x  . Hypertension   . Reflux    laryngotracheal   . 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 Vaginal Pap smear, abnormal        Objective:     10/14/2020       234  04/12/2020      219            01/28/2020         226   08/07/19 220 lb 12.8 oz (100.2 kg)  07/21/19 218 lb (98.9 kg)  07/08/19 225 lb (102.1 kg)     Vital signs reviewed  10/14/2020  - Note at rest 02 sats  96% on RA   General appearance:    amb  obese (BMI 44)  Hoarse wf  With classic pseudowheeze    HEENT : pt wearing mask not removed for exam due to covid -19 concerns.    NECK :  without JVD/Nodes/TM/ nl carotid upstrokes bilaterally   LUNGS: no acc muscle use,  Nl contour  chest which is clear to A and P bilaterally without cough on insp or exp maneuvers   CV:  RRR  no s3 or murmur or increase in P2, and no edema   ABD:  soft and nontender with nl inspiratory excursion in the supine position. No bruits or organomegaly appreciated, bowel sounds nl  MS:  Nl gait/ ext warm without deformities, calf tenderness, cyanosis or clubbing No obvious joint restrictions   SKIN: warm and dry without lesions    NEURO:  alert, approp, nl sensorium with  no motor or cerebellar deficits apparent.        CXR PA and Lateral:   10/14/2020 :    I personally reviewed images /  impression as follows:   No acute changes       Assessment

## 2020-10-14 NOTE — Patient Instructions (Addendum)
Depomedrol 120 mg IM today   For cough mucinex dm 1200 mg every 12 hours and supplement with tessalon up to 200mg  every 6 hours as needed   GERD (REFLUX)  is an extremely common cause of respiratory symptoms just like yours , many times with no obvious heartburn at all.    It can be treated with medication, but also with lifestyle changes including elevation of the head of your bed (ideally with 6 -8inch blocks under the headboard of your bed),  Smoking cessation, avoidance of late meals, excessive alcohol, and avoid fatty foods, chocolate, peppermint, colas, red wine, and acidic juices such as orange juice.  NO MINT OR MENTHOL PRODUCTS SO NO COUGH DROPS  USE SUGARLESS CANDY INSTEAD (Jolley ranchers or Stover's or Life Savers) or even ice chips will also do - the key is to swallow to prevent all throat clearing. NO OIL BASED VITAMINS - use powdered substitutes.  Avoid fish oil when coughing.    For breathing > nebulizer up to every 4 hours as needed x 72 then resume prior   omnicef 300mg  twice daily x 10 days   Please remember to go to the  x-ray department  for your tests - we will call you with the results when they are available    Follow up is as needed with Dr Benjamine Mola

## 2020-10-15 ENCOUNTER — Encounter: Payer: Self-pay | Admitting: Internal Medicine

## 2020-10-15 NOTE — Assessment & Plan Note (Signed)
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   rec 65mnicef 300 mg bid x 10 days as reaction to pcn was rash, not anaphylaxis Mucinex dm supplemented with tessalon 200 and avoid opioids if possible. Max rx for gerd          Each maintenance medication was reviewed in detail including emphasizing most importantly the difference between maintenance and prns and under what circumstances the prns are to be triggered using an action plan format where appropriate.  Total time for H and P, chart review, counseling, and generating customized AVS unique to this acute office visit / same day charting = > 30 min

## 2020-10-18 ENCOUNTER — Encounter: Payer: Self-pay | Admitting: *Deleted

## 2020-11-16 ENCOUNTER — Other Ambulatory Visit: Payer: Self-pay | Admitting: Orthopedic Surgery

## 2021-02-21 ENCOUNTER — Other Ambulatory Visit: Payer: Self-pay | Admitting: Orthopedic Surgery

## 2021-02-21 DIAGNOSIS — R200 Anesthesia of skin: Secondary | ICD-10-CM

## 2021-02-23 ENCOUNTER — Other Ambulatory Visit: Payer: Self-pay | Admitting: *Deleted

## 2021-02-23 MED ORDER — BUDESONIDE-FORMOTEROL FUMARATE 80-4.5 MCG/ACT IN AERO: INHALATION_SPRAY | RESPIRATORY_TRACT | 12 refills | Status: DC

## 2021-03-22 ENCOUNTER — Other Ambulatory Visit (HOSPITAL_COMMUNITY): Payer: Self-pay | Admitting: Adult Health

## 2021-03-22 DIAGNOSIS — Z1231 Encounter for screening mammogram for malignant neoplasm of breast: Secondary | ICD-10-CM

## 2021-03-27 ENCOUNTER — Other Ambulatory Visit: Payer: Self-pay | Admitting: Adult Health

## 2021-03-27 MED ORDER — NYSTATIN 100000 UNIT/ML MT SUSP: 5.0000 mL | Freq: Four times a day (QID) | OROMUCOSAL | 1 refills | Status: DC

## 2021-03-27 NOTE — Progress Notes (Signed)
Rx nystatin susp

## 2021-04-14 ENCOUNTER — Ambulatory Visit (HOSPITAL_COMMUNITY): Payer: BC Managed Care – PPO

## 2021-04-28 ENCOUNTER — Other Ambulatory Visit: Payer: Self-pay | Admitting: Obstetrics & Gynecology

## 2021-05-11 ENCOUNTER — Ambulatory Visit (HOSPITAL_COMMUNITY)
Admission: RE | Admit: 2021-05-11 | Discharge: 2021-05-11 | Disposition: A | Discharge status: Home / Self Care | Payer: BC Managed Care – PPO | Source: Ambulatory Visit | Attending: Adult Health | Admitting: Adult Health

## 2021-05-11 ENCOUNTER — Other Ambulatory Visit: Payer: Self-pay

## 2021-05-11 DIAGNOSIS — Z1231 Encounter for screening mammogram for malignant neoplasm of breast: Secondary | ICD-10-CM | POA: Diagnosis not present

## 2021-05-12 ENCOUNTER — Other Ambulatory Visit (HOSPITAL_COMMUNITY): Payer: Self-pay | Admitting: Adult Health

## 2021-05-12 DIAGNOSIS — R928 Other abnormal and inconclusive findings on diagnostic imaging of breast: Secondary | ICD-10-CM

## 2021-05-16 ENCOUNTER — Ambulatory Visit (HOSPITAL_COMMUNITY)
Admission: RE | Admit: 2021-05-16 | Discharge: 2021-05-16 | Disposition: A | Discharge status: Home / Self Care | Payer: BC Managed Care – PPO | Source: Ambulatory Visit | Attending: Adult Health | Admitting: Adult Health

## 2021-05-16 ENCOUNTER — Other Ambulatory Visit: Payer: Self-pay

## 2021-05-16 DIAGNOSIS — R928 Other abnormal and inconclusive findings on diagnostic imaging of breast: Secondary | ICD-10-CM | POA: Insufficient documentation

## 2021-05-18 ENCOUNTER — Other Ambulatory Visit: Payer: Self-pay | Admitting: Internal Medicine

## 2021-06-15 ENCOUNTER — Other Ambulatory Visit: Payer: Self-pay | Admitting: Adult Health

## 2021-06-15 MED ORDER — AZITHROMYCIN 250 MG PO TABS: ORAL_TABLET | ORAL | 0 refills | Status: DC

## 2021-06-15 NOTE — Progress Notes (Signed)
Rx Zpack

## 2021-08-04 ENCOUNTER — Other Ambulatory Visit: Payer: Self-pay | Admitting: Adult Health

## 2021-08-04 MED ORDER — FLUCONAZOLE 150 MG PO TABS: ORAL_TABLET | ORAL | 1 refills | Status: DC

## 2021-08-04 NOTE — Progress Notes (Signed)
Will rx diflucan  

## 2021-08-24 ENCOUNTER — Other Ambulatory Visit: Payer: Self-pay | Admitting: Orthopedic Surgery

## 2021-08-24 DIAGNOSIS — R200 Anesthesia of skin: Secondary | ICD-10-CM

## 2021-08-31 ENCOUNTER — Ambulatory Visit (INDEPENDENT_AMBULATORY_CARE_PROVIDER_SITE_OTHER): Payer: BC Managed Care – PPO | Admitting: Adult Health

## 2021-08-31 ENCOUNTER — Encounter: Payer: Self-pay | Admitting: Adult Health

## 2021-08-31 ENCOUNTER — Other Ambulatory Visit (HOSPITAL_COMMUNITY)
Admission: RE | Admit: 2021-08-31 | Discharge: 2021-08-31 | Disposition: A | Discharge status: Home / Self Care | Payer: BC Managed Care – PPO | Source: Ambulatory Visit | Attending: Adult Health | Admitting: Adult Health

## 2021-08-31 VITALS — BP 127/88 | HR 97 | Ht 61.0 in | Wt 230.0 lb

## 2021-08-31 DIAGNOSIS — Z01419 Encounter for gynecological examination (general) (routine) without abnormal findings: Secondary | ICD-10-CM | POA: Diagnosis present

## 2021-08-31 DIAGNOSIS — Z1211 Encounter for screening for malignant neoplasm of colon: Secondary | ICD-10-CM

## 2021-08-31 DIAGNOSIS — R87810 Cervical high risk human papillomavirus (HPV) DNA test positive: Secondary | ICD-10-CM | POA: Diagnosis not present

## 2021-08-31 DIAGNOSIS — M545 Low back pain, unspecified: Secondary | ICD-10-CM

## 2021-08-31 DIAGNOSIS — G8929 Other chronic pain: Secondary | ICD-10-CM

## 2021-08-31 LAB — HEMOCCULT GUIAC POC 1CARD (OFFICE): Fecal Occult Blood, POC: NEGATIVE

## 2021-08-31 MED ORDER — CYCLOBENZAPRINE HCL 10 MG PO TABS: 10.0000 mg | ORAL_TABLET | ORAL | 3 refills | Status: DC | PRN
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
## 2021-09-05 LAB — CYTOLOGY - PAP
Chlamydia: NEGATIVE
Comment: NEGATIVE
Comment: NEGATIVE
Comment: NORMAL
Diagnosis: NEGATIVE
High risk HPV: NEGATIVE
Neisseria Gonorrhea: NEGATIVE

## 2021-10-28 ENCOUNTER — Other Ambulatory Visit: Payer: Self-pay | Admitting: Obstetrics & Gynecology

## 2021-11-26 ENCOUNTER — Other Ambulatory Visit: Payer: Self-pay | Admitting: Internal Medicine

## 2021-11-27 NOTE — Telephone Encounter (Signed)
Please advise if you are okay refilling pt's med.

## 2022-01-25 ENCOUNTER — Ambulatory Visit: Payer: BC Managed Care – PPO | Admitting: Podiatry

## 2022-01-25 ENCOUNTER — Ambulatory Visit (INDEPENDENT_AMBULATORY_CARE_PROVIDER_SITE_OTHER): Payer: BC Managed Care – PPO

## 2022-01-25 ENCOUNTER — Encounter: Payer: Self-pay | Admitting: Podiatry

## 2022-01-25 DIAGNOSIS — M79675 Pain in left toe(s): Secondary | ICD-10-CM

## 2022-01-25 DIAGNOSIS — M779 Enthesopathy, unspecified: Secondary | ICD-10-CM

## 2022-01-25 DIAGNOSIS — M205X2 Other deformities of toe(s) (acquired), left foot: Secondary | ICD-10-CM | POA: Diagnosis not present

## 2022-01-25 MED ORDER — TRIAMCINOLONE ACETONIDE 10 MG/ML IJ SUSP
10.0000 mg | Freq: Once | INTRAMUSCULAR | Status: AC
  Administered 2022-01-25: 10 mg

## 2022-01-26 NOTE — Progress Notes (Signed)
Subjective:   Patient ID: Heather Mata, female   DOB: 46 y.o.   MRN: [id number]   HPI Patient presents stating she has had a lot of pain in her big toe joint left over right and its been going on for at least a year.  She has tried wider shoes she has tried other modalities without relief and it is gradually becoming more of an issue and is painful to walk with an it seems to be getting worse over time.  Patient does not smoke likes to be active   Review of Systems  All other systems reviewed and are negative.       Objective:  Physical Exam Vitals and nursing note reviewed.  Constitutional:      Appearance: She is well-developed.  Pulmonary:     Effort: Pulmonary effort is normal.  Musculoskeletal:        General: Normal range of motion.  Skin:    General: Skin is warm.  Neurological:     Mental Status: She is alert.     Neurovascular status intact muscle strength adequate range of motion within normal limits with patient found to have spurring of the left first metatarsal head dorsally with also structural deformity right and reduced range of motion first MPJ no crepitus of the joint noted with inflammation fluid around the joint surface left over right.  Patient is found to have good digital perfusion is well oriented x3 and presents with caregiver     Assessment:  Inflammatory capsulitis first MPJ left over right with structural bunion deformity and hallux limitus deformity left over right significant in nature for age     Plan:  Age NP x-rays reviewed and discussed.  At this point surgery for the left is going to be indicated she wants to get it done but needs to wait would like something temporary and I did do a periarticular injection around the first MPJ left 3 mg dexamethasone Kenalog 5 mg Xylocaine.  I do think biplanar osteotomy with shortening and lowering of the first metatarsal segment along with spur removal could be of great benefit to her and she wants to pursue  this treatment and at this time she was given all education concerning this.  Patient will review spine and be seen prior to the surgery when scheduled  X-rays indicate large spur first metatarsal head left over right narrowing of the joint surface multiple indications of structural hallux limitus deformity

## 2022-01-30 ENCOUNTER — Other Ambulatory Visit: Payer: Self-pay | Admitting: Adult Health

## 2022-02-14 ENCOUNTER — Other Ambulatory Visit: Payer: Self-pay | Admitting: Podiatry

## 2022-02-14 DIAGNOSIS — M779 Enthesopathy, unspecified: Secondary | ICD-10-CM

## 2022-03-03 ENCOUNTER — Other Ambulatory Visit: Payer: Self-pay | Admitting: Orthopedic Surgery

## 2022-03-03 DIAGNOSIS — R200 Anesthesia of skin: Secondary | ICD-10-CM

## 2022-03-12 ENCOUNTER — Telehealth: Payer: Self-pay | Admitting: Podiatry

## 2022-03-12 NOTE — Telephone Encounter (Signed)
DOS: 04/10/2022  [employer] [place]  Procedure: [place] Bunionectomy Bi-Planar Lt [phone])  DX: M20.12  Deductible: $1,250 with $1,250 remaining Out-of-Pocket: $4,890 with $4,341.85 remaining CoInsurance: 20%  No Prior Authorization is required per Jones Broom.  Call Ref #: [id number] [phone]

## 2022-03-14 ENCOUNTER — Encounter: Payer: Self-pay | Admitting: Podiatry

## 2022-03-18 ENCOUNTER — Other Ambulatory Visit: Payer: Self-pay | Admitting: Internal Medicine

## 2022-03-19 ENCOUNTER — Telehealth: Payer: Self-pay | Admitting: Internal Medicine

## 2022-03-19 MED ORDER — BUDESONIDE-FORMOTEROL FUMARATE 80-4.5 MCG/ACT IN AERO: INHALATION_SPRAY | RESPIRATORY_TRACT | 12 refills | Status: AC

## 2022-03-19 NOTE — Telephone Encounter (Signed)
Called and spoke to patient and went over medication she was needing refilled. She verbalized medication and pharmacy with me over the phone. Nothing further needed

## 2022-03-22 ENCOUNTER — Encounter: Payer: Self-pay | Admitting: Podiatry

## 2022-03-22 ENCOUNTER — Ambulatory Visit: Payer: BC Managed Care – PPO | Admitting: Podiatry

## 2022-03-22 DIAGNOSIS — M205X2 Other deformities of toe(s) (acquired), left foot: Secondary | ICD-10-CM | POA: Diagnosis not present

## 2022-03-22 NOTE — Progress Notes (Signed)
Subjective:   Patient ID: Heather Mata, female   DOB: 46 y.o.   MRN: [id number]   HPI Patient presents with mother with a painful big toe joint left that is increasingly hard to wear shoe gear with.  Patient states she is tried wider shoes she has tried padding the area she has tried anti-inflammatories and we have done injection without relief of symptoms and it is getting worse as time goes on.   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  I did discuss at great length the deformity and treatment and the fact we cannot control if there is any cartilage damage.  She understands this completely wants surgery and at this point I allowed her to read consent form going over alternative treatments complications from biplanar type osteotomy lowering and removing bone spurs with fixation.  I explained the procedure in great detail and she understands recovery wants surgery signed consent form scheduled for outpatient surgery with total recovery approximate 6 months and understands someday may require fusion or implantation procedure  Reviewed x-rays with her indicating large dorsal bone spur and narrowing of the joint surface and if we do not get this done at 1 point it will probably get worse and require fusion.  Dispensed air fracture walker properly fitted to her lower leg and it did do well and she will wear this before surgery to get used to it and then for after surgery will have it for the first recovery.

## 2022-03-26 ENCOUNTER — Other Ambulatory Visit: Payer: BC Managed Care – PPO

## 2022-03-31 ENCOUNTER — Encounter (INDEPENDENT_AMBULATORY_CARE_PROVIDER_SITE_OTHER): Payer: Self-pay | Admitting: Gastroenterology

## 2022-04-03 ENCOUNTER — Other Ambulatory Visit (HOSPITAL_COMMUNITY): Payer: Self-pay | Admitting: Adult Health

## 2022-04-03 DIAGNOSIS — Z1231 Encounter for screening mammogram for malignant neoplasm of breast: Secondary | ICD-10-CM

## 2022-04-09 MED ORDER — OXYCODONE-ACETAMINOPHEN 10-325 MG PO TABS: 1.0000 | ORAL_TABLET | ORAL | 0 refills | Status: DC | PRN

## 2022-04-09 MED ORDER — ONDANSETRON HCL 4 MG PO TABS: 4.0000 mg | ORAL_TABLET | Freq: Three times a day (TID) | ORAL | 0 refills | Status: DC | PRN

## 2022-04-09 NOTE — Addendum Note (Signed)
Addended by: Wallene Huh on: 04/09/2022 01:25 PM   Modules accepted: Orders

## 2022-04-10 ENCOUNTER — Telehealth: Payer: Self-pay | Admitting: *Deleted

## 2022-04-10 DIAGNOSIS — M2022 Hallux rigidus, left foot: Secondary | ICD-10-CM | POA: Diagnosis not present

## 2022-04-10 NOTE — Telephone Encounter (Signed)
Patient is calling for status of her 2 medications that were supposed to be sent to  pharmacy,explained that the physician has sent on 04/09/22, said that she will contact them.

## 2022-04-11 ENCOUNTER — Other Ambulatory Visit: Payer: Self-pay | Admitting: Podiatry

## 2022-04-11 ENCOUNTER — Other Ambulatory Visit: Payer: BC Managed Care – PPO

## 2022-04-11 ENCOUNTER — Telehealth: Payer: Self-pay | Admitting: *Deleted

## 2022-04-11 MED ORDER — HYDROCODONE-ACETAMINOPHEN 10-325 MG PO TABS
1.0000 | ORAL_TABLET | Freq: Three times a day (TID) | ORAL | 0 refills | Status: AC | PRN
Start: 2022-04-11 — End: 2022-04-16

## 2022-04-11 NOTE — Telephone Encounter (Signed)
No prior authorization information has came through for the patient from pharmacy so I can't do a PA without information.

## 2022-04-11 NOTE — Telephone Encounter (Signed)
Patient is aware and will pick up the Hydrocodone

## 2022-04-11 NOTE — Telephone Encounter (Addendum)
Patient is calling to ask if she can take boot off some during the night while sleeping, uncomfortable. Status of her medication prior approval.Called and [facility] said that they had faxed for approval , will refax.waiting for information from pharmacy.

## 2022-04-11 NOTE — Telephone Encounter (Signed)
Don't know why they need. I can send in hydrocodone

## 2022-04-12 HISTORY — PX: FOOT SURGERY: SHX648

## 2022-04-16 ENCOUNTER — Ambulatory Visit (INDEPENDENT_AMBULATORY_CARE_PROVIDER_SITE_OTHER): Payer: BC Managed Care – PPO | Admitting: Podiatry

## 2022-04-16 ENCOUNTER — Ambulatory Visit (INDEPENDENT_AMBULATORY_CARE_PROVIDER_SITE_OTHER): Payer: BC Managed Care – PPO

## 2022-04-16 ENCOUNTER — Other Ambulatory Visit: Payer: BC Managed Care – PPO

## 2022-04-16 ENCOUNTER — Encounter: Payer: BC Managed Care – PPO | Admitting: Podiatry

## 2022-04-16 DIAGNOSIS — M205X2 Other deformities of toe(s) (acquired), left foot: Secondary | ICD-10-CM

## 2022-04-19 NOTE — Progress Notes (Signed)
Subjective:   Patient ID: Heather Mata, female   DOB: 46 y.o.   MRN: [id number]   HPI Patient states doing very well with surgery very pleased so far   ROS      Objective:  Physical Exam  Neuro vas scaler status intact negative Bevelyn Buckles' sign noted wound edges coapted well good alignment noted with good range of motion no crepitus     Assessment:  Doing well post distal osteotomy     Plan:  H&P x-ray reviewed and I went ahead today and reapplied sterile dressing advised on range of motion exercises first MPJ dispensed surgical shoe and continue with shoe or boot and Ace wrap.  Reappoint in 3 weeks or earlier if necessary and if she is doing well 3 weeks with x-ray then she will begin ankle compression stocking and regular shoe gear  X-rays indicate osteotomies healing well fixation in place joint congruence

## 2022-04-30 ENCOUNTER — Other Ambulatory Visit: Payer: BC Managed Care – PPO

## 2022-05-03 ENCOUNTER — Ambulatory Visit (INDEPENDENT_AMBULATORY_CARE_PROVIDER_SITE_OTHER): Payer: BC Managed Care – PPO

## 2022-05-03 ENCOUNTER — Other Ambulatory Visit: Payer: BC Managed Care – PPO

## 2022-05-03 DIAGNOSIS — M205X2 Other deformities of toe(s) (acquired), left foot: Secondary | ICD-10-CM | POA: Diagnosis not present

## 2022-05-03 NOTE — Progress Notes (Signed)
Patient presents today for post op visit # 2, patient of Dr. Paulla Dolly.   POV # 2 DOS 04/10/22 Altamese Howe LT    She presents in her surgical shoe. Denies any falls or injury to the foot. Foot is slightly swollen. No signs of infection. No calf pain or shortness of breath. Bandages dry and intact. Incision is intact.     Xrays taken today and reviewed by Dr. Paulla Dolly. He did take a look at her foot today as well.   Anklet compression sleeve dispensed and patient will wear her supportive shoe out of the office. Reviewed icing and elevation. She will follow up with Dr. Paulla Dolly as needed.

## 2022-05-18 ENCOUNTER — Ambulatory Visit (HOSPITAL_COMMUNITY)
Admission: RE | Admit: 2022-05-18 | Discharge: 2022-05-18 | Disposition: A | Discharge status: Home / Self Care | Payer: BC Managed Care – PPO | Source: Ambulatory Visit | Attending: Adult Health | Admitting: Adult Health

## 2022-05-18 DIAGNOSIS — Z1231 Encounter for screening mammogram for malignant neoplasm of breast: Secondary | ICD-10-CM | POA: Insufficient documentation

## 2022-05-27 ENCOUNTER — Other Ambulatory Visit: Payer: Self-pay | Admitting: Obstetrics & Gynecology

## 2022-06-26 ENCOUNTER — Other Ambulatory Visit: Payer: Self-pay | Admitting: Adult Health

## 2022-06-26 MED ORDER — PHENAZOPYRIDINE HCL 200 MG PO TABS: 200.0000 mg | ORAL_TABLET | Freq: Three times a day (TID) | ORAL | 0 refills | Status: DC | PRN

## 2022-06-26 MED ORDER — SULFAMETHOXAZOLE-TRIMETHOPRIM 800-160 MG PO TABS: 1.0000 | ORAL_TABLET | Freq: Two times a day (BID) | ORAL | 0 refills | Status: DC

## 2022-06-26 NOTE — Progress Notes (Signed)
Rx sent for septra ds and pyridium

## 2022-07-19 ENCOUNTER — Encounter: Payer: Self-pay | Admitting: Radiology

## 2022-08-23 ENCOUNTER — Other Ambulatory Visit: Payer: Self-pay | Admitting: Orthopedic Surgery

## 2022-08-23 DIAGNOSIS — R200 Anesthesia of skin: Secondary | ICD-10-CM

## 2022-10-24 ENCOUNTER — Other Ambulatory Visit: Payer: Self-pay | Admitting: Adult Health

## 2022-11-21 ENCOUNTER — Ambulatory Visit (INDEPENDENT_AMBULATORY_CARE_PROVIDER_SITE_OTHER): Payer: BC Managed Care – PPO | Admitting: Adult Health

## 2022-11-21 ENCOUNTER — Other Ambulatory Visit (HOSPITAL_COMMUNITY)
Admission: RE | Admit: 2022-11-21 | Discharge: 2022-11-21 | Disposition: A | Discharge status: Home / Self Care | Payer: BC Managed Care – PPO | Source: Ambulatory Visit | Attending: Adult Health | Admitting: Adult Health

## 2022-11-21 ENCOUNTER — Encounter: Payer: Self-pay | Admitting: Adult Health

## 2022-11-21 ENCOUNTER — Other Ambulatory Visit: Payer: Self-pay | Admitting: Adult Health

## 2022-11-21 VITALS — BP 126/78 | HR 96 | Ht 61.0 in | Wt 237.0 lb

## 2022-11-21 DIAGNOSIS — R1031 Right lower quadrant pain: Secondary | ICD-10-CM | POA: Diagnosis not present

## 2022-11-21 DIAGNOSIS — Z01419 Encounter for gynecological examination (general) (routine) without abnormal findings: Secondary | ICD-10-CM | POA: Insufficient documentation

## 2022-11-21 DIAGNOSIS — Z87410 Personal history of cervical dysplasia: Secondary | ICD-10-CM

## 2022-11-21 NOTE — Progress Notes (Signed)
Patient ID: Heather Mata, female   DOB: [birth year], 47 y.o.   MRN: [id number] History of Present Illness: Heather Mata  is a 47 year old white female,single, [id number], in for a well woman gyn exam and she requests a pap.  PCP is Dr Margo Aye.  Current Medications, Allergies, Past Medical History, Past Surgical History, Family History and Social History were reviewed in [employer] record.     Review of Systems: Patient denies any headaches, hearing loss, fatigue, blurred vision, shortness of breath, chest pain, abdominal pain, problems with bowel movements, urination, or intercourse.(Not active). No joint pain or mood swings.  Has RLQ pain on and off.    Physical Exam:BP 126/78 (BP Location: Left Arm, Patient Position: Sitting, Cuff Size: Large)   Pulse 96   Ht 5\' 1"  (1.549 m)   Wt 237 lb (107.5 kg)   LMP 11/21/2022   BMI 44.78 kg/m   General:  Well developed, well nourished, no acute distress Skin:  Warm and dry Neck:  Midline trachea, normal thyroid, good ROM, no lymphadenopathy Lungs; Clear to auscultation bilaterally Breast:  No dominant palpable mass, retraction, or nipple discharge Cardiovascular: Regular rate and rhythm Abdomen:  Soft, non tender, no hepatosplenomegaly Pelvic:  External genitalia is normal in appearance, no lesions.  The vagina is normal in appearance(+blood). Urethra has no lesions or masses. The cervix is smooth, pap with HR HPV genotyping performed.  Uterus is felt to be normal size, shape, and contour.  No adnexal masses or tenderness noted.Bladder is non tender, no masses felt. Rectal: Deferred Extremities/musculoskeletal:  No swelling or varicosities noted, no clubbing or cyanosis Psych:  No mood changes, alert and cooperative,seems happy AA is 1 Fall risk is low    11/21/2022    3:39 PM 08/31/2021    3:48 PM 08/25/2020    2:57 PM  Depression screen PHQ 2/9  Decreased Interest 0 0 0  Down, Depressed, Hopeless 0 0 0  PHQ - 2 Score 0 0 0   Altered sleeping 0 0 0  Tired, decreased energy 0 0 0  Change in appetite 0 0 0  Feeling bad or failure about yourself  0 0 0  Trouble concentrating 0 0 0  Moving slowly or fidgety/restless 0 0 0  Suicidal thoughts 0 0 0  PHQ-9 Score 0 0 0       11/21/2022    3:39 PM 08/31/2021    3:48 PM 08/25/2020    2:58 PM  GAD 7 : Generalized Anxiety Score  Nervous, Anxious, on Edge 1 0 1  Control/stop worrying 1 0 1  Worry too much - different things 2 0 1  Trouble relaxing 1 0 1  Restless 2 0 0  Easily annoyed or irritable 1 0 0  Afraid - awful might happen 1 0 0  Total GAD 7 Score 9 0 4  Has xanax     Upstream - 11/21/22 1555       Pregnancy Intention Screening   Does the patient want to become pregnant in the next year? No    Does the patient's partner want to become pregnant in the next year? No    Would the patient like to discuss contraceptive options today? No      Contraception Wrap Up   Current Method Abstinence    End Method Abstinence;Female Condom             Examination chaperoned by Malachy Mood LPN   Impression and Plan: 1. Encounter  for gynecological examination with Papanicolaou smear of cervix Pap sent Pap in 3 years if normal Physical in 1 year Labs with PCP Mammogram was negative 05/18/22.  Had negative cologuard last year she says  - Cytology - PAP( [place])  2. History of cervical dysplasia Pap sent   3. RLQ abdominal pain Has RLQ pain on and off  Scheduled pelvic US at [facility] 12/06/22 at 3:30 pm  - US PELVIC COMPLETE WITH TRANSVAGINAL; Future

## 2022-11-27 LAB — CYTOLOGY - PAP
Adequacy: ABSENT
Comment: NEGATIVE
Diagnosis: NEGATIVE
High risk HPV: NEGATIVE

## 2022-12-06 ENCOUNTER — Ambulatory Visit (HOSPITAL_COMMUNITY): Payer: BC Managed Care – PPO

## 2022-12-10 ENCOUNTER — Ambulatory Visit (INDEPENDENT_AMBULATORY_CARE_PROVIDER_SITE_OTHER): Payer: BC Managed Care – PPO

## 2022-12-10 DIAGNOSIS — R1031 Right lower quadrant pain: Secondary | ICD-10-CM | POA: Diagnosis not present

## 2022-12-10 NOTE — Progress Notes (Signed)
PELVIC US TA/TV: enlarged heterogeneous anteverted uterus,mid/fundal left subserosal fibroid 6.3 x 5.2 x 5.6 cm,right unilocular hemorrhagic ovarian cyst 3.1 x 2.9 x 3.1 cm,normal left ovary,multiple simple nabothian cysts,EEC 4 mm,no free fluid,no pain during ultrasound

## 2022-12-22 ENCOUNTER — Other Ambulatory Visit: Payer: Self-pay | Admitting: Obstetrics & Gynecology

## 2023-02-22 ENCOUNTER — Other Ambulatory Visit: Payer: Self-pay | Admitting: Orthopedic Surgery

## 2023-02-22 DIAGNOSIS — R200 Anesthesia of skin: Secondary | ICD-10-CM

## 2023-04-05 ENCOUNTER — Other Ambulatory Visit (HOSPITAL_COMMUNITY): Payer: Self-pay | Admitting: Adult Health

## 2023-04-05 DIAGNOSIS — Z1231 Encounter for screening mammogram for malignant neoplasm of breast: Secondary | ICD-10-CM

## 2023-04-23 ENCOUNTER — Other Ambulatory Visit: Payer: Self-pay | Admitting: Adult Health

## 2023-04-24 ENCOUNTER — Telehealth: Payer: Self-pay | Admitting: Internal Medicine

## 2023-04-24 NOTE — Telephone Encounter (Signed)
Needs a refill on her symbicort

## 2023-05-01 NOTE — Telephone Encounter (Signed)
LVM patient not seen since 09/2020; We are not able to refill Symbicort She will need appt so please call to schedule. Also she may ask her pcp if they can do a courtesy refill.

## 2023-05-24 ENCOUNTER — Encounter (HOSPITAL_COMMUNITY): Payer: Self-pay

## 2023-05-24 ENCOUNTER — Ambulatory Visit (HOSPITAL_COMMUNITY)
Admission: RE | Admit: 2023-05-24 | Discharge: 2023-05-24 | Disposition: A | Discharge status: Home / Self Care | Payer: 59 | Source: Ambulatory Visit | Attending: Adult Health | Admitting: Adult Health

## 2023-05-24 DIAGNOSIS — Z1231 Encounter for screening mammogram for malignant neoplasm of breast: Secondary | ICD-10-CM | POA: Insufficient documentation

## 2023-07-04 ENCOUNTER — Other Ambulatory Visit: Payer: Self-pay | Admitting: Adult Health

## 2023-07-13 ENCOUNTER — Other Ambulatory Visit: Payer: Self-pay | Admitting: Obstetrics & Gynecology

## 2023-09-05 ENCOUNTER — Other Ambulatory Visit: Payer: Self-pay | Admitting: Orthopedic Surgery

## 2023-09-05 DIAGNOSIS — R200 Anesthesia of skin: Secondary | ICD-10-CM

## 2023-09-09 ENCOUNTER — Telehealth: Payer: Self-pay | Admitting: Orthopedic Surgery

## 2023-09-09 NOTE — Telephone Encounter (Signed)
 Dr. Delfino Fellers pt - pt lvm on 09/07/23 6:25pm stating that she only has enough Meloxicam  7.5 for two more days.  She is requesting a refill, but she is asking for the dosage to be increased.  She's like [facility].  [phone]

## 2023-11-29 ENCOUNTER — Other Ambulatory Visit: Payer: Self-pay | Admitting: Adult Health

## 2023-11-29 MED ORDER — AZITHROMYCIN 250 MG PO TABS: ORAL_TABLET | ORAL | 0 refills | Status: DC

## 2023-11-29 NOTE — Progress Notes (Signed)
 Rx  Zpack and push fluids

## 2023-12-19 ENCOUNTER — Ambulatory Visit: Admitting: Adult Health

## 2023-12-19 ENCOUNTER — Other Ambulatory Visit (HOSPITAL_COMMUNITY)
Admission: RE | Admit: 2023-12-19 | Discharge: 2023-12-19 | Disposition: A | Discharge status: Home / Self Care | Source: Ambulatory Visit | Attending: Adult Health | Admitting: Adult Health

## 2023-12-19 ENCOUNTER — Encounter: Payer: Self-pay | Admitting: Adult Health

## 2023-12-19 VITALS — BP 138/87 | HR 93 | Ht 61.0 in | Wt 234.0 lb

## 2023-12-19 DIAGNOSIS — Z1211 Encounter for screening for malignant neoplasm of colon: Secondary | ICD-10-CM

## 2023-12-19 DIAGNOSIS — Z01419 Encounter for gynecological examination (general) (routine) without abnormal findings: Secondary | ICD-10-CM

## 2023-12-19 DIAGNOSIS — Z87410 Personal history of cervical dysplasia: Secondary | ICD-10-CM | POA: Diagnosis not present

## 2023-12-19 DIAGNOSIS — M2550 Pain in unspecified joint: Secondary | ICD-10-CM | POA: Diagnosis not present

## 2023-12-19 DIAGNOSIS — R61 Generalized hyperhidrosis: Secondary | ICD-10-CM | POA: Insufficient documentation

## 2023-12-19 DIAGNOSIS — Z1331 Encounter for screening for depression: Secondary | ICD-10-CM

## 2023-12-19 DIAGNOSIS — R232 Flushing: Secondary | ICD-10-CM

## 2023-12-19 DIAGNOSIS — N951 Menopausal and female climacteric states: Secondary | ICD-10-CM

## 2023-12-19 LAB — HEMOCCULT GUIAC POC 1CARD (OFFICE): Fecal Occult Blood, POC: NEGATIVE

## 2023-12-19 NOTE — Progress Notes (Signed)
 Patient ID: Heather Mata, female   DOB: 08/10/1975, 48 y.o.   MRN: [id number] History of Present Illness: Heather Mata is a 48 year old white female,single, [id number] in for a well woman gyn exam and pap. She is skipping periods, having hot flashes, night seats and brain fog and body aches. Her sister recently diagnosed with ALS.  PCP is Dr Shona   Current Medications, Allergies, Past Medical History, Past Surgical History, Family History and Social History were reviewed in [employer] electronic medical record.     Review of Systems: Patient denies any headaches, hearing loss, fatigue, blurred vision, shortness of breath, chest pain, abdominal pain, problems with bowel movements, urination(some stress UI), or intercourse(not active). No joint pain or mood swings.  See HPI for positives   Physical Exam:BP 138/87 (BP Location: Right Arm, Patient Position: Sitting, Cuff Size: Large)   Pulse 93   Ht 5' 1 (1.549 m)   Wt 234 lb (106.1 kg)   LMP 11/28/2023 (Approximate)   BMI 44.21 kg/m   General:  Well developed, well nourished, no acute distress Skin:  Warm and dry Neck:  Midline trachea, normal thyroid, good ROM, no lymphadenopathy Lungs; Clear to auscultation bilaterally Breast:  No dominant palpable mass, retraction, or nipple discharge Cardiovascular: Regular rate and rhythm Abdomen:  Soft, non tender, no hepatosplenomegaly Pelvic:  External genitalia is normal in appearance, no lesions.  The vagina is normal in appearance. Urethra has no lesions or masses. The cervix is smooth, pap with HR HPV genotyping performed.  Uterus is felt to be normal size, shape, and contour.  No adnexal masses or tenderness noted.Bladder is non tender, no masses felt. Rectal: Good sphincter tone, no polyps, or hemorrhoids felt.  Hemoccult negative. Extremities/musculoskeletal:  No swelling or varicosities noted, no clubbing or cyanosis Psych:  No mood changes, alert and cooperative,seems happy AA is 1 Fall risk  is low    12/19/2023    3:56 PM 11/21/2022    3:39 PM 08/31/2021    3:48 PM  Depression screen PHQ 2/9  Decreased Interest 1 0 0  Down, Depressed, Hopeless 1 0 0  PHQ - 2 Score 2 0 0  Altered sleeping 1 0 0  Tired, decreased energy 2 0 0  Change in appetite 1 0 0  Feeling bad or failure about yourself  1 0 0  Trouble concentrating 0 0 0  Moving slowly or fidgety/restless 0 0 0  Suicidal thoughts 0 0 0  PHQ-9 Score 7 0 0   On meds    12/19/2023    3:56 PM 11/21/2022    3:39 PM 08/31/2021    3:48 PM 08/25/2020    2:58 PM  GAD 7 : Generalized Anxiety Score  Nervous, Anxious, on Edge 1 1 0 1  Control/stop worrying 0 1 0 1  Worry too much - different things 1 2 0 1  Trouble relaxing 1 1 0 1  Restless 0 2 0 0  Easily annoyed or irritable 1 1 0 0  Afraid - awful might happen 0 1 0 0  Total GAD 7 Score 4 9 0 4    Upstream - 12/19/23 1551       Pregnancy Intention Screening   Does the patient want to become pregnant in the next year? No    Does the patient's partner want to become pregnant in the next year? No    Would the patient like to discuss contraceptive options today? No      Contraception  Wrap Up   Current Method Abstinence    End Method Abstinence;Female Condom           Examination chaperoned by Clarita Salt Lpn   Impression and plan: 1. Encounter for gynecological examination with Papanicolaou smear of cervix (Primary) Pap sent Physical in 1 year Labs with PCP Mammogram was negative 05/24/23 - Cytology - PAP( [place])  2. History of cervical dysplasia Pap sent   3. Hot flashes Has flashes, declines meds   4. Encounter for screening fecal occult blood testing Hemoccult was negative  - POCT occult blood stool  5. Night sweats +night sweats  6. Arthralgia, unspecified joint Talk with PCP, but can be perimenopause   7. Perimenopause  Skipping periods and having hot flashes

## 2023-12-25 ENCOUNTER — Ambulatory Visit: Payer: Self-pay | Admitting: Adult Health

## 2023-12-25 LAB — CYTOLOGY - PAP
Adequacy: ABSENT
Comment: NEGATIVE
Diagnosis: NEGATIVE
High risk HPV: NEGATIVE

## 2024-01-24 ENCOUNTER — Other Ambulatory Visit: Payer: Self-pay | Admitting: Obstetrics & Gynecology

## 2024-03-02 ENCOUNTER — Other Ambulatory Visit: Payer: Self-pay | Admitting: Adult Health

## 2024-03-02 DIAGNOSIS — R1031 Right lower quadrant pain: Secondary | ICD-10-CM

## 2024-03-02 NOTE — Progress Notes (Signed)
 RLQ pain, hx of ovary cyst, will get US 

## 2024-03-04 ENCOUNTER — Encounter (INDEPENDENT_AMBULATORY_CARE_PROVIDER_SITE_OTHER): Payer: Self-pay | Admitting: Gastroenterology

## 2024-03-15 ENCOUNTER — Other Ambulatory Visit: Payer: Self-pay | Admitting: Orthopedic Surgery

## 2024-03-15 DIAGNOSIS — R200 Anesthesia of skin: Secondary | ICD-10-CM

## 2024-03-16 ENCOUNTER — Ambulatory Visit: Admitting: Radiology

## 2024-03-16 DIAGNOSIS — R1031 Right lower quadrant pain: Secondary | ICD-10-CM

## 2024-03-16 DIAGNOSIS — D251 Intramural leiomyoma of uterus: Secondary | ICD-10-CM

## 2024-03-16 NOTE — Progress Notes (Signed)
 GYN US : TA and TV imaging performed - vinyl probe cover used - Chaperone: Clarita Anteverted uterus slightly enlarged with 6 cm subserosal fibroid off left fundal wall, another small intramural fibroid seen in mid posterior wall = 7 mm  Myometrium appears symmetrical Endom thickness = 2.50mm, uniform avascular cavity and canal, no evidence of intracavitary defects,  numerous cervical nabothian cysts are present. Ovaries appear normal in size,  The ovaries appear mobile, neg adnexal regions, no evidence of Rt ov cyst.  neg CDS, no free fluid present

## 2024-03-18 ENCOUNTER — Ambulatory Visit: Payer: Self-pay | Admitting: Adult Health

## 2024-05-07 ENCOUNTER — Other Ambulatory Visit: Payer: Self-pay | Admitting: Adult Health

## 2024-05-07 MED ORDER — AZITHROMYCIN 250 MG PO TABS: ORAL_TABLET | ORAL | 0 refills | Status: AC

## 2024-05-07 MED ORDER — FLUCONAZOLE 150 MG PO TABS: ORAL_TABLET | ORAL | 1 refills | Status: AC

## 2024-05-07 NOTE — Progress Notes (Signed)
 Rx diflucan  and  Z pack

## 2024-07-17 ENCOUNTER — Encounter (HOSPITAL_COMMUNITY)

## 2024-07-17 DIAGNOSIS — Z1231 Encounter for screening mammogram for malignant neoplasm of breast: Secondary | ICD-10-CM
